# Patient Record
Sex: Female | Born: 1970 | Race: White | Hispanic: No | Marital: Single | Smoking: Former smoker
Health system: Southern US, Community
[De-identification: ages and names within clinical notes are randomized; demographics above are authoritative.]

## PROBLEM LIST (undated history)

## (undated) DIAGNOSIS — E039 Hypothyroidism, unspecified: Secondary | ICD-10-CM

## (undated) DIAGNOSIS — E559 Vitamin D deficiency, unspecified: Secondary | ICD-10-CM

## (undated) DIAGNOSIS — F419 Anxiety disorder, unspecified: Secondary | ICD-10-CM

## (undated) HISTORY — DX: Hypothyroidism, unspecified: E03.9

## (undated) HISTORY — PX: EYE SURGERY: SHX253

## (undated) HISTORY — PX: HERNIA REPAIR: SHX51

## (undated) HISTORY — PX: INCONTINENCE SURGERY: SHX676

## (undated) HISTORY — DX: Anxiety disorder, unspecified: F41.9

## (undated) HISTORY — DX: Vitamin D deficiency, unspecified: E55.9

---

## 2021-08-23 ENCOUNTER — Encounter: Payer: Self-pay | Admitting: Nurse Practitioner

## 2021-08-23 ENCOUNTER — Other Ambulatory Visit: Payer: Self-pay

## 2021-08-23 ENCOUNTER — Ambulatory Visit (INDEPENDENT_AMBULATORY_CARE_PROVIDER_SITE_OTHER): Payer: Managed Care, Other (non HMO) | Admitting: Nurse Practitioner

## 2021-08-23 VITALS — BP 120/70 | HR 88 | Temp 97.3°F | Ht 63.75 in | Wt 304.6 lb

## 2021-08-23 DIAGNOSIS — E039 Hypothyroidism, unspecified: Secondary | ICD-10-CM | POA: Insufficient documentation

## 2021-08-23 DIAGNOSIS — E559 Vitamin D deficiency, unspecified: Secondary | ICD-10-CM

## 2021-08-23 LAB — COMPREHENSIVE METABOLIC PANEL
ALT: 49 U/L — ABNORMAL HIGH (ref 0–35)
AST: 28 U/L (ref 0–37)
Albumin: 4.1 g/dL (ref 3.5–5.2)
Alkaline Phosphatase: 69 U/L (ref 39–117)
BUN: 16 mg/dL (ref 6–23)
CO2: 31 mEq/L (ref 19–32)
Calcium: 9.4 mg/dL (ref 8.4–10.5)
Chloride: 103 mEq/L (ref 96–112)
Creatinine, Ser: 0.73 mg/dL (ref 0.40–1.20)
GFR: 94.76 mL/min (ref >60.00)
Glucose, Bld: 81 mg/dL (ref 70–99)
Potassium: 4.4 mEq/L (ref 3.5–5.1)
Sodium: 140 mEq/L (ref 135–145)
Total Bilirubin: 0.4 mg/dL (ref 0.2–1.2)
Total Protein: 6.7 g/dL (ref 6.0–8.3)

## 2021-08-23 LAB — HEMOGLOBIN A1C: Hgb A1c MFr Bld: 6 % (ref 4.6–6.5)

## 2021-08-23 LAB — VITAMIN D 25 HYDROXY (VIT D DEFICIENCY, FRACTURES): VITD: 27.09 ng/mL — ABNORMAL LOW (ref 30.00–100.00)

## 2021-08-23 NOTE — Assessment & Plan Note (Addendum)
Repeat Vit. D: low Start 50000IU weekly Repeat in 6weeks

## 2021-08-23 NOTE — Assessment & Plan Note (Addendum)
Diagnosed 52yrs ago by pcp in [place], [place] Thyroid US also completed: no thyroid nodule per patient Current levothyroxine dose increased 60months ago. Continues to struggle with weight gain and faitgue.  Repeat thyroid panel today: low tsh and t4 D/c Maintain Repeat labs in 6weeks

## 2021-08-23 NOTE — Patient Instructions (Signed)
Thank you for choosing [facility]  Go to lab for blood day  Sign medical release form to get your records from previous providers.  Let me know if you want referral entered to [facility].  How to Increase Your Level of Physical Activity Getting regular physical activity is important for your overall health and well-being. Most people do not get enough exercise. There are easy ways to increase your level of physical activity, even if you have not been very active in the past or if you are just starting out. What are the benefits of physical activity? Physical activity has many short-term and long-term benefits. Being active on a regular basis can improve your physical and mental health as well as provide other benefits. Physical health benefits Helping you lose weight or maintain a healthy weight. Strengthening your muscles and bones. Reducing your risk of certain long-term (chronic) diseases, including heart disease, cancer, and diabetes. Being able to move around more easily and for longer periods of time without getting tired (increased endurance or stamina). Improving your ability to fight off illness (enhanced immunity). Being able to sleep better. Helping you stay healthy as you get older, including: Helping you stay mobile, or capable of walking and moving around. Preventing accidents, such as falls. Increasing life expectancy. Mental health benefits Boosting your mood and improving your self-esteem. Lowering your chance of having mental health problems, such as depression or anxiety. Helping you feel good about your body. Other benefits Finding new sources of fun and enjoyment. Meeting new people who share a common interest. Before you begin If you have a chronic illness or have not been active for a while, check with your health care provider about how to get started. Ask your health care provider what activities are safe for you. Start out  slowly. Walking or doing some simple chair exercises is a good place to start, especially if you have not been active before or for a long time. Set goals that you can work toward. Ask your health care provider how much exercise is best for you. In general, most adults should: Do moderate-intensity exercise for at least 150 minutes each week (30 minutes on most days of the week) or vigorous exercise for at least 75 minutes each week, or a combination of these. Moderate-intensity exercise can include walking at a quick pace, biking, yoga, water aerobics, or gardening. Vigorous exercise involves activities that take more effort, such as jogging or running, playing sports, swimming laps, or jumping rope. Do strength exercises on at least 2 days each week. This can include weight lifting, body weight exercises, and resistance-band exercises. How to be more physically active Make a plan  Try to find activities that you enjoy. You are more likely to commit to an exercise routine if it does not feel like a chore. If you have bone or joint problems, choose low-impact exercises, like walking or swimming. Use these tips for being successful with an exercise plan: Find a workout partner for accountability. Join a group or class, such as an aerobics class, cycling class, or sports team. Make family time active. Go for a walk, bike, or swim. Include a variety of exercises each week. Consider using a fitness tracker, such as a mobile phone app or a device worn like a watch, that will count the number of steps you take each day. Many people strive to reach 10,000 steps a day. Find ways to be active in your daily routines Besides  your formal exercise plans, you can find ways to do physical activity during your daily routines, such as: Walking or biking to work or to the store. Taking the stairs instead of the elevator. Parking farther away from the door at work or at the store. Planning walking  meetings. Walking around while you are on the phone. Where to find more information Centers for Disease Control and Prevention: [employer].pt [employer]'s Council on Fitness, Sports & Nutrition: www.fitness.gov ChooseMyPlate: http://www.harvey.com/ Contact a health care provider if: You have headaches, muscle aches, or joint pain that is concerning. You feel dizzy or light-headed while exercising. You faint. You feel your heart skipping, racing, or fluttering. You have chest pain while exercising. Summary Exercise benefits your mind and body at any age, even if you are just starting out. If you have a chronic illness or have not been active for a while, check with your health care provider before increasing your physical activity. Choose activities that are safe and enjoyable for you. Ask your health care provider what activities are safe for you. Start slowly. Tell your health care provider if you have problems as you start to increase your activity level. This information is not intended to replace advice given to you by your health care provider. Make sure you discuss any questions you have with your health care provider. Document Revised: 03/19/2021 Document Reviewed: 03/19/2021 Elsevier Patient Education  2022 [employer].

## 2021-08-23 NOTE — Progress Notes (Addendum)
Subjective:  Patient ID: Kaitlyn Fowler, female    DOB: [birth year]  Age: 50 y.o. MRN: [id number]  CC: Establish Care (New patient/Would like to have thyroid checked. /Declines vaccines today, will schedule a CPE and discuss at that visit. /)  HPI Relocated from [place], [place]  Obesity: Continuous weight gain despite dietary changes and decreased portion size. She stopped exercise regimen due to lack of results  Hypothyroidism Diagnosed 36yrs ago by pcp in [place], [place] Thyroid US also completed: no thyroid nodule per patient Current levothyroxine dose increased 29months ago. Continues to struggle with weight gain and faitgue.  Repeat thyroid panel today: low tsh and t4 D/c Maintain Repeat labs in 6weeks  Vitamin D deficiency Repeat Vit. D: low Start 50000IU weekly Repeat in 6weeks  Reviewed past Medical, Social and Family history today.  Outpatient Medications Prior to Visit  Medication Sig Dispense Refill   citalopram (CELEXA) 20 MG tablet Take 20 mg by mouth daily.     orphenadrine (NORFLEX) 100 MG tablet Take 100 mg by mouth 2 (two) times daily.     levothyroxine (SYNTHROID) 100 MCG tablet Take 100 mcg by mouth daily.     levothyroxine (SYNTHROID) 88 MCG tablet Take 88 mcg by mouth daily.     No facility-administered medications prior to visit.    ROS See HPI  Objective:  BP 120/70 (BP Location: Left Arm, Patient Position: Sitting, Cuff Size: Large)   Pulse 88   Temp (!) 97.3 F (36.3 C) (Temporal)   Ht 5' 3.75" (1.619 m)   Wt (!) 304 lb 9.6 oz (138.2 kg)   LMP 06/07/2021 (Within Weeks)   SpO2 98%   BMI 52.70 kg/m   Physical Exam Vitals reviewed.  Constitutional:      Appearance: She is obese.  Neck:     Thyroid: No thyroid mass, thyromegaly or thyroid tenderness.  Cardiovascular:     Rate and Rhythm: Normal rate and regular rhythm.     Pulses: Normal pulses.     Heart sounds: Normal heart sounds.  Pulmonary:     Effort: Pulmonary effort is  normal.     Breath sounds: Normal breath sounds.  Musculoskeletal:     Cervical back: Normal range of motion.     Right lower leg: No edema.     Left lower leg: No edema.  Neurological:     Mental Status: She is alert and oriented to person, place, and time.   Assessment & Plan:  This visit occurred during the SARS-CoV-2 public health emergency.  Safety protocols were in place, including screening questions prior to the visit, additional usage of staff PPE, and extensive cleaning of exam room while observing appropriate contact time as indicated for disinfecting solutions.   Analy was seen today for establish care.  Diagnoses and all orders for this visit:  Hypothyroidism, unspecified type -     Thyroid Panel With TSH -     Comprehensive metabolic panel -     T4, free; Future -     TSH; Future -     levothyroxine (SYNTHROID) 100 MCG tablet; Take 1 tablet (100 mcg total) by mouth daily before breakfast.  Morbid obesity (HCC) -     Comprehensive metabolic panel -     Hemoglobin A1c  Vitamin D deficiency -     Vitamin D (25 hydroxy) -     VITAMIN D 25 Hydroxy (Vit-D Deficiency, Fractures); Future -     Vitamin D, Ergocalciferol, (DRISDOL) 1.25 MG (  50000 UNIT) CAPS capsule; Take 1 capsule (50,000 Units total) by mouth every 7 (seven) days.   Problem List Items Addressed This Visit       Endocrine   Hypothyroidism - Primary    Diagnosed 62yrs ago by pcp in [place], [place] Thyroid US also completed: no thyroid nodule per patient Current levothyroxine dose increased 36months ago. Continues to struggle with weight gain and faitgue.  Repeat thyroid panel today: low tsh and t4 D/c Maintain Repeat labs in 6weeks      Relevant Medications   levothyroxine (SYNTHROID) 100 MCG tablet   Other Relevant Orders   Thyroid Panel With TSH (Completed)   Comprehensive metabolic panel (Completed)   T4, free   TSH     Other   Vitamin D deficiency    Repeat Vit. D: low Start  50000IU weekly Repeat in 6weeks      Relevant Medications   Vitamin D, Ergocalciferol, (DRISDOL) 1.25 MG (50000 UNIT) CAPS capsule   Other Relevant Orders   Vitamin D (25 hydroxy) (Completed)   VITAMIN D 25 Hydroxy (Vit-D Deficiency, Fractures)   Other Visit Diagnoses     Morbid obesity (HCC)       Relevant Orders   Comprehensive metabolic panel (Completed)   Hemoglobin A1c (Completed)       Follow-up: Return in about 3 months (around 11/22/2021) for CPE (fasting).  [name], NP

## 2021-08-24 LAB — THYROID PANEL WITH TSH
Free Thyroxine Index: 4.2 — ABNORMAL HIGH (ref 1.4–3.8)
T3 Uptake: 31 % (ref 22–35)
T4, Total: 13.4 ug/dL — ABNORMAL HIGH (ref 5.1–11.9)
TSH: 0.05 mIU/L — ABNORMAL LOW

## 2021-08-24 MED ORDER — VITAMIN D (ERGOCALCIFEROL) 1.25 MG (50000 UNIT) PO CAPS: 50000.0000 [IU] | ORAL_CAPSULE | ORAL | 0 refills | Status: DC

## 2021-08-24 MED ORDER — LEVOTHYROXINE SODIUM 100 MCG PO TABS: 100.0000 ug | ORAL_TABLET | Freq: Every day | ORAL | 1 refills | Status: DC

## 2021-08-24 NOTE — Addendum Note (Signed)
Addended by: [name] L on: 08/24/2021 01:42 PM   Modules accepted: Orders, Level of Service

## 2021-08-25 ENCOUNTER — Telehealth: Payer: Self-pay | Admitting: Nurse Practitioner

## 2021-08-25 NOTE — Telephone Encounter (Signed)
Pt is wanting a cb concerning her most recent lab results. Please advise 

## 2021-08-26 ENCOUNTER — Telehealth: Payer: Self-pay

## 2021-08-26 NOTE — Telephone Encounter (Signed)
Spoke to patient and gave lab results for ov 08/23/21. Informed patient of provider instructions. Patient voiced understanding. Sw, cma

## 2021-08-27 NOTE — Telephone Encounter (Signed)
Called and spoke to patient. All concerns resloved 

## 2021-10-07 ENCOUNTER — Other Ambulatory Visit (INDEPENDENT_AMBULATORY_CARE_PROVIDER_SITE_OTHER): Payer: Managed Care, Other (non HMO)

## 2021-10-07 ENCOUNTER — Other Ambulatory Visit: Payer: Self-pay

## 2021-10-07 DIAGNOSIS — E559 Vitamin D deficiency, unspecified: Secondary | ICD-10-CM

## 2021-10-07 DIAGNOSIS — E039 Hypothyroidism, unspecified: Secondary | ICD-10-CM

## 2021-10-08 LAB — TSH: TSH: 9.8 u[IU]/mL — ABNORMAL HIGH (ref 0.35–5.50)

## 2021-10-08 LAB — VITAMIN D 25 HYDROXY (VIT D DEFICIENCY, FRACTURES): VITD: 31.49 ng/mL (ref 30.00–100.00)

## 2021-10-08 LAB — T4, FREE: Free T4: 0.87 ng/dL (ref 0.60–1.60)

## 2021-10-10 MED ORDER — LEVOTHYROXINE SODIUM 112 MCG PO TABS: 112.0000 ug | ORAL_TABLET | Freq: Every day | ORAL | 1 refills | Status: DC

## 2021-10-10 MED ORDER — VITAMIN D3 125 MCG (5000 UT) PO CAPS: 5000.0000 [IU] | ORAL_CAPSULE | Freq: Every day | ORAL | Status: AC

## 2021-10-10 NOTE — Addendum Note (Signed)
Addended by: [name] L on: 10/10/2021 12:01 PM   Modules accepted: Orders

## 2021-10-10 NOTE — Progress Notes (Signed)
See note

## 2021-10-22 ENCOUNTER — Encounter: Payer: Self-pay | Admitting: Nurse Practitioner

## 2021-11-20 ENCOUNTER — Other Ambulatory Visit: Payer: Self-pay | Admitting: Nurse Practitioner

## 2021-11-20 DIAGNOSIS — E559 Vitamin D deficiency, unspecified: Secondary | ICD-10-CM

## 2021-11-22 ENCOUNTER — Ambulatory Visit (INDEPENDENT_AMBULATORY_CARE_PROVIDER_SITE_OTHER): Payer: Managed Care, Other (non HMO) | Admitting: Nurse Practitioner

## 2021-11-22 ENCOUNTER — Other Ambulatory Visit: Payer: Self-pay

## 2021-11-22 ENCOUNTER — Encounter: Payer: Self-pay | Admitting: Nurse Practitioner

## 2021-11-22 ENCOUNTER — Other Ambulatory Visit (HOSPITAL_COMMUNITY)
Admission: RE | Admit: 2021-11-22 | Discharge: 2021-11-22 | Disposition: A | Discharge status: Home / Self Care | Payer: Managed Care, Other (non HMO) | Source: Ambulatory Visit | Attending: Nurse Practitioner | Admitting: Nurse Practitioner

## 2021-11-22 VITALS — BP 124/84 | HR 68 | Temp 97.4°F | Ht 63.0 in | Wt 317.6 lb

## 2021-11-22 DIAGNOSIS — Z13220 Encounter for screening for lipoid disorders: Secondary | ICD-10-CM

## 2021-11-22 DIAGNOSIS — Z1231 Encounter for screening mammogram for malignant neoplasm of breast: Secondary | ICD-10-CM | POA: Diagnosis not present

## 2021-11-22 DIAGNOSIS — Z124 Encounter for screening for malignant neoplasm of cervix: Secondary | ICD-10-CM

## 2021-11-22 DIAGNOSIS — Z1211 Encounter for screening for malignant neoplasm of colon: Secondary | ICD-10-CM | POA: Diagnosis not present

## 2021-11-22 DIAGNOSIS — E039 Hypothyroidism, unspecified: Secondary | ICD-10-CM

## 2021-11-22 DIAGNOSIS — Z136 Encounter for screening for cardiovascular disorders: Secondary | ICD-10-CM

## 2021-11-22 DIAGNOSIS — R739 Hyperglycemia, unspecified: Secondary | ICD-10-CM

## 2021-11-22 DIAGNOSIS — Z0001 Encounter for general adult medical examination with abnormal findings: Secondary | ICD-10-CM

## 2021-11-22 DIAGNOSIS — E559 Vitamin D deficiency, unspecified: Secondary | ICD-10-CM

## 2021-11-22 NOTE — Patient Instructions (Addendum)
Find out if Cologuard is covered by your insurance  Schedule appt for annual eye exam [facility] [employer].  Schedule appt for dental cleaning  You will be contacted to schedule appt for colonoscopy and mammogram  Schedule fasting lab appt.  Preventive Care 28-52 Years Old, Female Preventive care refers to lifestyle choices and visits with your health care provider that can promote health and wellness. Preventive care visits are also called wellness exams. What can I expect for my preventive care visit? Counseling Your health care provider may ask you questions about your: Medical history, including: Past medical problems. Family medical history. Pregnancy history. Current health, including: Menstrual cycle. Method of birth control. Emotional well-being. Home life and relationship well-being. Sexual activity and sexual health. Lifestyle, including: Alcohol, nicotine or tobacco, and drug use. Access to firearms. Diet, exercise, and sleep habits. Work and work Statistician. Sunscreen use. Safety issues such as seatbelt and bike helmet use. Physical exam Your health care provider will check your: Height and weight. These may be used to calculate your BMI (body mass index). BMI is a measurement that tells if you are at a healthy weight. Waist circumference. This measures the distance around your waistline. This measurement also tells if you are at a healthy weight and may help predict your risk of certain diseases, such as type 2 diabetes and high blood pressure. Heart rate and blood pressure. Body temperature. Skin for abnormal spots. What immunizations do I need? Vaccines are usually given at various ages, according to a schedule. Your health care provider will recommend vaccines for you based on your age, medical history, and lifestyle or other factors, such as travel or where you work. What tests do I need? Screening Your health care provider  may recommend screening tests for certain conditions. This may include: Lipid and cholesterol levels. Diabetes screening. This is done by checking your blood sugar (glucose) after you have not eaten for a while (fasting). Pelvic exam and Pap test. Hepatitis B test. Hepatitis C test. HIV (human immunodeficiency virus) test. STI (sexually transmitted infection) testing, if you are at risk. Lung cancer screening. Colorectal cancer screening. Mammogram. Talk with your health care provider about when you should start having regular mammograms. This may depend on whether you have a family history of breast cancer. BRCA-related cancer screening. This may be done if you have a family history of breast, ovarian, tubal, or peritoneal cancers. Bone density scan. This is done to screen for osteoporosis. Talk with your health care provider about your test results, treatment options, and if necessary, the need for more tests. Follow these instructions at home: Eating and drinking  Eat a diet that includes fresh fruits and vegetables, whole grains, lean protein, and low-fat dairy products. Take vitamin and mineral supplements as recommended by your health care provider. Do not drink alcohol if: Your health care provider tells you not to drink. You are pregnant, may be pregnant, or are planning to become pregnant. If you drink alcohol: Limit how much you have to 0-1 drink a day. Know how much alcohol is in your drink. In the [place] one drink equals one 12 oz bottle of beer (355 mL), one 5 oz glass of wine (148 mL), or one 1 oz glass of hard liquor (44 mL). Lifestyle Brush your teeth every morning and night with fluoride toothpaste. Floss one time each day. Exercise for at least 30 minutes 5 or more days each week. Do not use any products  that contain nicotine or tobacco. These products include cigarettes, chewing tobacco, and vaping devices, such as e-cigarettes. If you need help quitting, ask your  health care provider. Do not use drugs. If you are sexually active, practice safe sex. Use a condom or other form of protection to prevent STIs. If you do not wish to become pregnant, use a form of birth control. If you plan to become pregnant, see your health care provider for a prepregnancy visit. Take aspirin only as told by your health care provider. Make sure that you understand how much to take and what form to take. Work with your health care provider to find out whether it is safe and beneficial for you to take aspirin daily. Find healthy ways to manage stress, such as: Meditation, yoga, or listening to music. Journaling. Talking to a trusted person. Spending time with friends and family. Minimize exposure to UV radiation to reduce your risk of skin cancer. Safety Always wear your seat belt while driving or riding in a vehicle. Do not drive: If you have been drinking alcohol. Do not ride with someone who has been drinking. When you are tired or distracted. While texting. If you have been using any mind-altering substances or drugs. Wear a helmet and other protective equipment during sports activities. If you have firearms in your house, make sure you follow all gun safety procedures. Seek help if you have been physically or sexually abused. What's next? Visit your health care provider once a year for an annual wellness visit. Ask your health care provider how often you should have your eyes and teeth checked. Stay up to date on all vaccines. This information is not intended to replace advice given to you by your health care provider. Make sure you discuss any questions you have with your health care provider. Document Revised: 05/19/2021 Document Reviewed: 05/19/2021 Elsevier Patient Education  [employer].

## 2021-11-22 NOTE — Progress Notes (Signed)
Subjective:    Patient ID: Kaitlyn Fowler, female    DOB: [birth year], 50 y.o.   MRN: [id number]  Patient presents today for CPE and eval of chronic conditions  HPI Hypothyroidism Repeat TSH and T4 Maintain current levothyroxine dose  Vitamin D deficiency Repeat Vit. D Vision: will schedule Dental:will schedule Diet:regular Exercise:walking some, plans to increase frequency. Weight:  Wt Readings from Last 3 Encounters:  11/22/21 (!) 317 lb 9.6 oz (144.1 kg)  08/23/21 (!) 304 lb 9.6 oz (138.2 kg)    Sexual History (orientation,birth control, marital status, STD):no STD screen, needs breast and pelvic exam today  Depression/Suicide: Depression screen [facility] 2/9 11/22/2021 08/23/2021  Decreased Interest 0 0  Down, Depressed, Hopeless 0 0  PHQ - 2 Score 0 0  Altered sleeping 1 3  Tired, decreased energy 1 2  Change in appetite 0 0  Feeling bad or failure about yourself  0 0  Trouble concentrating 0 0  Moving slowly or fidgety/restless 0 0  Suicidal thoughts 0 0  PHQ-9 Score 2 5  Difficult doing work/chores Not difficult at all Somewhat difficult   GAD 7 : Generalized Anxiety Score 11/22/2021 08/23/2021  Nervous, Anxious, on Edge 0 0  Control/stop worrying 0 0  Worry too much - different things 0 0  Trouble relaxing 0 0  Restless 0 0  Easily annoyed or irritable 0 0  Afraid - awful might happen 0 0  Total GAD 7 Score 0 0    Immunizations: (TDAP, Hep C screen, Pneumovax, Influenza, zoster)  Health Maintenance  Topic Date Due   HIV Screening  Never done   Pap Smear  Never done   Colon Cancer Screening  Never done   Mammogram  Never done   Zoster (Shingles) Vaccine (1 of 2) Never done   COVID-19 Vaccine (4 - Booster for Pfizer series) 03/13/2021   Flu Shot  03/04/2022*   Hepatitis C Screening: USPSTF Recommendation to screen - Ages 18-79 yo.  11/22/2022*   Tetanus Vaccine  11/16/2026   Pneumococcal Vaccination  Aged Out   HPV Vaccine  Aged Out  *Topic was postponed.  The date shown is not the original due date.   Fall Risk: Fall Risk  11/22/2021 08/23/2021  Falls in the past year? 0 0  Number falls in past yr: 0 0  Injury with Fall? 0 0  Risk for fall due to : No Fall Risks No Fall Risks  Follow up Falls evaluation completed Falls evaluation completed   Medications and allergies reviewed with patient and updated if appropriate.  Patient Active Problem List   Diagnosis Date Noted   Hypothyroidism 08/23/2021   Vitamin D deficiency 08/23/2021    Current Outpatient Medications on File Prior to Visit  Medication Sig Dispense Refill   Cholecalciferol (VITAMIN D3) 125 MCG (5000 UT) CAPS Take 1 capsule (5,000 Units total) by mouth daily. 30 capsule    citalopram (CELEXA) 20 MG tablet Take 20 mg by mouth daily.     levothyroxine (SYNTHROID) 112 MCG tablet Take 1 tablet (112 mcg total) by mouth daily before breakfast. 90 tablet 1   orphenadrine (NORFLEX) 100 MG tablet Take 100 mg by mouth 2 (two) times daily.     No current facility-administered medications on file prior to visit.    History reviewed. No pertinent past medical history.  Past Surgical History:  Procedure Laterality Date   HERNIA REPAIR     INCONTINENCE SURGERY      Social History  Socioeconomic History   Marital status: Single    Spouse name: Not on file   Number of children: 1   Years of education: Not on file   Highest education level: Not on file  Occupational History   Not on file  Tobacco Use   Smoking status: Former    Types: Cigarettes   Smokeless tobacco: Never   Tobacco comments:    Stopped 2018  Vaping Use   Vaping Use: Never used  Substance and Sexual Activity   Alcohol use: Never   Drug use: Never   Sexual activity: Not Currently    Birth control/protection: None  Other Topics Concern   Not on file  Social History Narrative   Not on file   Social Determinants of Health   Financial Resource Strain: Not on file  Food Insecurity: Not on file   Transportation Needs: Not on file  Physical Activity: Not on file  Stress: Not on file  Social Connections: Not on file   Family History  Adopted: Yes       Review of Systems  Constitutional:  Negative for fever, malaise/fatigue and weight loss.  HENT:  Negative for congestion and sore throat.   Eyes:        Negative for visual changes  Respiratory:  Negative for cough and shortness of breath.   Cardiovascular:  Negative for chest pain, palpitations and leg swelling.  Gastrointestinal:  Negative for blood in stool, constipation, diarrhea and heartburn.  Genitourinary:  Negative for dysuria, frequency and urgency.  Musculoskeletal:  Negative for falls, joint pain and myalgias.  Skin:  Negative for rash.  Neurological:  Negative for dizziness, sensory change and headaches.  Endo/Heme/Allergies:  Does not bruise/bleed easily.  Psychiatric/Behavioral:  Negative for depression, hallucinations, substance abuse and suicidal ideas. The patient is not nervous/anxious and does not have insomnia.    Objective:   Vitals:   11/22/21 1341  BP: 124/84  Pulse: 68  Temp: (!) 97.4 F (36.3 C)  SpO2: 98%   Body mass index is 56.26 kg/m.  Physical Examination:  Physical Exam Vitals reviewed. Exam conducted with a chaperone present.  Constitutional:      General: She is not in acute distress.    Appearance: She is well-developed. She is obese.  HENT:     Right Ear: Tympanic membrane, ear canal and external ear normal.     Left Ear: Tympanic membrane, ear canal and external ear normal.     Nose: Nose normal.  Eyes:     Extraocular Movements: Extraocular movements intact.     Conjunctiva/sclera: Conjunctivae normal.  Cardiovascular:     Rate and Rhythm: Normal rate and regular rhythm.     Pulses: Normal pulses.     Heart sounds: Normal heart sounds.  Pulmonary:     Effort: Pulmonary effort is normal. No respiratory distress.     Breath sounds: Normal breath sounds.  Chest:      Chest wall: No tenderness.  Breasts:    Breasts are symmetrical.     Left: Normal.  Abdominal:     General: Bowel sounds are normal.     Palpations: Abdomen is soft.     Hernia: There is no hernia in the left inguinal area or right inguinal area.  Genitourinary:    General: Normal vulva.     Labia:        Right: No rash, tenderness or lesion.        Left: No rash, tenderness or lesion.  Vagina: Vaginal discharge present. No erythema, tenderness or bleeding.     Cervix: Normal.     Uterus: Normal.      Adnexa: Right adnexa normal and left adnexa normal.  Musculoskeletal:        General: Normal range of motion.     Cervical back: Normal range of motion and neck supple.     Right lower leg: No edema.     Left lower leg: No edema.  Lymphadenopathy:     Cervical: No cervical adenopathy.     Upper Body:     Right upper body: No supraclavicular, axillary or pectoral adenopathy.     Left upper body: No supraclavicular, axillary or pectoral adenopathy.     Lower Body: No right inguinal adenopathy. No left inguinal adenopathy.  Skin:    General: Skin is warm and dry.  Neurological:     Mental Status: She is alert and oriented to person, place, and time.     Deep Tendon Reflexes: Reflexes are normal and symmetric.  Psychiatric:        Mood and Affect: Mood normal.        Behavior: Behavior normal.        Thought Content: Thought content normal.    ASSESSMENT and PLAN: This visit occurred during the SARS-CoV-2 public health emergency.  Safety protocols were in place, including screening questions prior to the visit, additional usage of staff PPE, and extensive cleaning of exam room while observing appropriate contact time as indicated for disinfecting solutions.   Eliz was seen today for annual exam.  Diagnoses and all orders for this visit:  Encounter for preventative adult health care exam with abnormal findings -     MM 3D SCREEN BREAST BILATERAL; Future -     Ambulatory  referral to Gastroenterology -     CBC w/Diff; Future -     Cytology - PAP  Vitamin D deficiency -     Vitamin D (25 hydroxy); Future  Hypothyroidism, unspecified type -     T4, free; Future -     TSH; Future -     MM 3D SCREEN BREAST BILATERAL; Future  Encounter for lipid screening for cardiovascular disease  Breast cancer screening by mammogram -     MM 3D SCREEN BREAST BILATERAL; Future  Colon cancer screening -     Ambulatory referral to Gastroenterology  Hyperglycemia -     Hemoglobin A1c; Future  Encounter for Papanicolaou smear for cervical cancer screening -     Cytology - PAP      Problem List Items Addressed This Visit       Endocrine   Hypothyroidism    Repeat TSH and T4 Maintain current levothyroxine dose      Relevant Orders   T4, free   TSH   MM 3D SCREEN BREAST BILATERAL     Other   Vitamin D deficiency    Repeat Vit. D      Relevant Orders   Vitamin D (25 hydroxy)   Other Visit Diagnoses     Encounter for preventative adult health care exam with abnormal findings    -  Primary   Relevant Orders   MM 3D SCREEN BREAST BILATERAL   Ambulatory referral to Gastroenterology   CBC w/Diff   Cytology - PAP   Encounter for lipid screening for cardiovascular disease       Breast cancer screening by mammogram       Relevant Orders   MM 3D  SCREEN BREAST BILATERAL   Colon cancer screening       Relevant Orders   Ambulatory referral to Gastroenterology   Hyperglycemia       Relevant Orders   Hemoglobin A1c   Encounter for Papanicolaou smear for cervical cancer screening       Relevant Orders   Cytology - PAP       Follow up: Return in about 3 months (around 02/20/2022) for hypothyrodism, weight management.  [name], NP

## 2021-11-22 NOTE — Assessment & Plan Note (Signed)
Repeat TSH and T4 °Maintain current levothyroxine dose °

## 2021-11-22 NOTE — Assessment & Plan Note (Signed)
Repeat Vit. D 

## 2021-11-24 ENCOUNTER — Telehealth: Payer: Self-pay | Admitting: Nurse Practitioner

## 2021-11-24 LAB — CYTOLOGY - PAP
Comment: NEGATIVE
Diagnosis: NEGATIVE
High risk HPV: NEGATIVE

## 2021-11-24 NOTE — Telephone Encounter (Signed)
What is the name of the medication? Cholecalciferol (VITAMIN D3) 125 MCG (5000 UT) CAPS [[id number]]   Have you contacted your pharmacy to request a refill? Pt is needing a refill.  Which pharmacy would you like this sent to? [id number],  - [address]  [address] [place] [address]  Phone:  [phone]  Fax:  [phone]  DEA #:  [id number]  Patient notified that their request is being sent to the clinical staff for review and that they should receive a call once it is complete. If they do not receive a call within 72 hours they can check with their pharmacy or our office.

## 2021-11-24 NOTE — Telephone Encounter (Signed)
Pt informed medication is OTC medication and does not need a prescription. Pt verbalized understanding.

## 2021-11-25 ENCOUNTER — Other Ambulatory Visit (INDEPENDENT_AMBULATORY_CARE_PROVIDER_SITE_OTHER): Payer: Managed Care, Other (non HMO)

## 2021-11-25 ENCOUNTER — Other Ambulatory Visit: Payer: Self-pay

## 2021-11-25 DIAGNOSIS — E559 Vitamin D deficiency, unspecified: Secondary | ICD-10-CM

## 2021-11-25 DIAGNOSIS — Z0001 Encounter for general adult medical examination with abnormal findings: Secondary | ICD-10-CM

## 2021-11-25 DIAGNOSIS — R739 Hyperglycemia, unspecified: Secondary | ICD-10-CM | POA: Diagnosis not present

## 2021-11-25 DIAGNOSIS — E039 Hypothyroidism, unspecified: Secondary | ICD-10-CM | POA: Diagnosis not present

## 2021-11-25 LAB — CBC WITH DIFFERENTIAL/PLATELET
Basophils Absolute: 0 10*3/uL (ref 0.0–0.1)
Basophils Relative: 0.5 % (ref 0.0–3.0)
Eosinophils Absolute: 0.2 10*3/uL (ref 0.0–0.7)
Eosinophils Relative: 2.6 % (ref 0.0–5.0)
HCT: 41.3 % (ref 36.0–46.0)
Hemoglobin: 13.3 g/dL (ref 12.0–15.0)
Lymphocytes Relative: 29.7 % (ref 12.0–46.0)
Lymphs Abs: 2 10*3/uL (ref 0.7–4.0)
MCHC: 32.1 g/dL (ref 30.0–36.0)
MCV: 79.7 fl (ref 78.0–100.0)
Monocytes Absolute: 0.4 10*3/uL (ref 0.1–1.0)
Monocytes Relative: 5.8 % (ref 3.0–12.0)
Neutro Abs: 4.1 10*3/uL (ref 1.4–7.7)
Neutrophils Relative %: 61.4 % (ref 43.0–77.0)
Platelets: 342 10*3/uL (ref 150.0–400.0)
RBC: 5.18 Mil/uL — ABNORMAL HIGH (ref 3.87–5.11)
RDW: 15.3 % (ref 11.5–15.5)
WBC: 6.6 10*3/uL (ref 4.0–10.5)

## 2021-11-25 LAB — TSH: TSH: 15.8 u[IU]/mL — ABNORMAL HIGH (ref 0.35–5.50)

## 2021-11-25 LAB — T4, FREE: Free T4: 0.91 ng/dL (ref 0.60–1.60)

## 2021-11-25 LAB — HEMOGLOBIN A1C: Hgb A1c MFr Bld: 6.1 % (ref 4.6–6.5)

## 2021-11-25 LAB — VITAMIN D 25 HYDROXY (VIT D DEFICIENCY, FRACTURES): VITD: 34.07 ng/mL (ref 30.00–100.00)

## 2021-11-26 LAB — THYROID PANEL WITH TSH
Free Thyroxine Index: 2.5 (ref 1.4–3.8)
T3 Uptake: 26 % (ref 22–35)
T4, Total: 9.7 ug/dL (ref 5.1–11.9)
TSH: 17.67 mIU/L — ABNORMAL HIGH

## 2021-11-29 MED ORDER — LEVOTHYROXINE SODIUM 125 MCG PO TABS: 125.0000 ug | ORAL_TABLET | Freq: Every day | ORAL | 0 refills | Status: DC

## 2021-12-01 ENCOUNTER — Encounter: Payer: Self-pay | Admitting: Nurse Practitioner

## 2021-12-02 NOTE — Telephone Encounter (Signed)
Per last lab notes patient is to take 5000 units daily. Pt already has been notified and made aware of this

## 2021-12-10 ENCOUNTER — Encounter: Payer: Self-pay | Admitting: Nurse Practitioner

## 2021-12-30 ENCOUNTER — Other Ambulatory Visit: Payer: Self-pay

## 2021-12-30 ENCOUNTER — Other Ambulatory Visit (INDEPENDENT_AMBULATORY_CARE_PROVIDER_SITE_OTHER): Payer: Managed Care, Other (non HMO)

## 2021-12-30 DIAGNOSIS — E039 Hypothyroidism, unspecified: Secondary | ICD-10-CM

## 2021-12-31 LAB — THYROID PANEL WITH TSH
Free Thyroxine Index: 2.5 (ref 1.4–3.8)
T3 Uptake: 28 % (ref 22–35)
T4, Total: 8.8 ug/dL (ref 5.1–11.9)
TSH: 10.12 m[IU]/L — ABNORMAL HIGH

## 2021-12-31 MED ORDER — LEVOTHYROXINE SODIUM 125 MCG PO TABS: 125.0000 ug | ORAL_TABLET | Freq: Every day | ORAL | 0 refills | Status: DC

## 2021-12-31 NOTE — Addendum Note (Signed)
Addended by: [name] L on: 12/31/2021 02:50 PM   Modules accepted: Orders

## 2022-01-06 ENCOUNTER — Ambulatory Visit
Admission: RE | Admit: 2022-01-06 | Discharge: 2022-01-06 | Disposition: A | Discharge status: Home / Self Care | Payer: Managed Care, Other (non HMO) | Source: Ambulatory Visit | Attending: Nurse Practitioner | Admitting: Nurse Practitioner

## 2022-01-06 DIAGNOSIS — Z0001 Encounter for general adult medical examination with abnormal findings: Secondary | ICD-10-CM

## 2022-01-06 DIAGNOSIS — Z1231 Encounter for screening mammogram for malignant neoplasm of breast: Secondary | ICD-10-CM

## 2022-01-06 DIAGNOSIS — E039 Hypothyroidism, unspecified: Secondary | ICD-10-CM

## 2022-01-07 ENCOUNTER — Ambulatory Visit
Admission: RE | Admit: 2022-01-07 | Discharge: 2022-01-07 | Disposition: A | Discharge status: Home / Self Care | Payer: Managed Care, Other (non HMO) | Source: Ambulatory Visit | Attending: Nurse Practitioner | Admitting: Nurse Practitioner

## 2022-01-07 ENCOUNTER — Other Ambulatory Visit: Payer: Self-pay

## 2022-01-10 ENCOUNTER — Encounter: Payer: Self-pay | Admitting: Nurse Practitioner

## 2022-01-10 NOTE — Telephone Encounter (Signed)
Refill request for: Citalopram 20 mg LR  HX provider LOV 11/15/18/22 FOV  02/24/22  Please review and advise.  Thanks.  Dm/cma

## 2022-01-11 MED ORDER — CITALOPRAM HYDROBROMIDE 20 MG PO TABS: 20.0000 mg | ORAL_TABLET | Freq: Every day | ORAL | 3 refills | Status: DC

## 2022-01-17 ENCOUNTER — Other Ambulatory Visit: Payer: Self-pay | Admitting: Nurse Practitioner

## 2022-01-17 DIAGNOSIS — R928 Other abnormal and inconclusive findings on diagnostic imaging of breast: Secondary | ICD-10-CM

## 2022-01-28 ENCOUNTER — Ambulatory Visit
Admission: RE | Admit: 2022-01-28 | Discharge: 2022-01-28 | Disposition: A | Discharge status: Home / Self Care | Payer: Managed Care, Other (non HMO) | Source: Ambulatory Visit | Attending: Nurse Practitioner | Admitting: Nurse Practitioner

## 2022-01-28 ENCOUNTER — Other Ambulatory Visit: Payer: Self-pay

## 2022-01-28 DIAGNOSIS — R928 Other abnormal and inconclusive findings on diagnostic imaging of breast: Secondary | ICD-10-CM

## 2022-02-24 ENCOUNTER — Ambulatory Visit: Payer: Managed Care, Other (non HMO) | Admitting: Nurse Practitioner

## 2022-02-25 ENCOUNTER — Other Ambulatory Visit: Payer: Self-pay | Admitting: Nurse Practitioner

## 2022-02-25 DIAGNOSIS — E039 Hypothyroidism, unspecified: Secondary | ICD-10-CM

## 2022-02-28 ENCOUNTER — Encounter: Payer: Self-pay | Admitting: Nurse Practitioner

## 2022-03-04 ENCOUNTER — Telehealth: Payer: Self-pay | Admitting: Nurse Practitioner

## 2022-03-04 ENCOUNTER — Other Ambulatory Visit: Payer: Self-pay

## 2022-03-04 DIAGNOSIS — E039 Hypothyroidism, unspecified: Secondary | ICD-10-CM

## 2022-03-04 MED ORDER — LEVOTHYROXINE SODIUM 125 MCG PO TABS: 125.0000 ug | ORAL_TABLET | Freq: Every day | ORAL | 0 refills | Status: DC

## 2022-03-04 NOTE — Telephone Encounter (Signed)
Pt has moved and Rx needs to be sent to new pharmacy.  ?

## 2022-03-04 NOTE — Telephone Encounter (Signed)
Pt is requesting that her meds be sent to the [facility] she needs her levothyroxine refilled she took her last pill today. ?

## 2022-03-07 NOTE — Telephone Encounter (Signed)
Rx filled on 03/04/22 ?

## 2022-06-23 ENCOUNTER — Ambulatory Visit: Payer: Managed Care, Other (non HMO) | Admitting: Nurse Practitioner

## 2022-06-23 ENCOUNTER — Encounter: Payer: Self-pay | Admitting: Nurse Practitioner

## 2022-06-23 VITALS — BP 133/84 | HR 65 | Wt 314.8 lb

## 2022-06-23 DIAGNOSIS — N3001 Acute cystitis with hematuria: Secondary | ICD-10-CM

## 2022-06-23 DIAGNOSIS — R350 Frequency of micturition: Secondary | ICD-10-CM | POA: Diagnosis not present

## 2022-06-23 LAB — POCT URINALYSIS DIPSTICK
Bilirubin, UA: NEGATIVE
Glucose, UA: NEGATIVE
Ketones, UA: NEGATIVE
Nitrite, UA: NEGATIVE
Protein, UA: POSITIVE — AB
Spec Grav, UA: >=1.03 — AB
Urobilinogen, UA: NEGATIVE U/dL — AB
pH, UA: 5.5

## 2022-06-23 MED ORDER — NITROFURANTOIN MONOHYD MACRO 100 MG PO CAPS: 100.0000 mg | ORAL_CAPSULE | Freq: Two times a day (BID) | ORAL | 0 refills | Status: DC

## 2022-06-23 NOTE — Patient Instructions (Signed)
It was great to see you!  Start macrobid twice a day for 5 days with food. Drink plenty of fluids. You can take azo as needed over the counter for urinary pain or burning.   Let's follow-up if your symptoms worsen or don't improve.   Take care,  Rodman Pickle, NP

## 2022-06-23 NOTE — Progress Notes (Signed)
Acute Office Visit  Subjective:     Patient ID: Kaitlyn Fowler, female    DOB: [birth year], 51 y.o.   MRN: [id number]  Chief Complaint  Patient presents with   Urinary Tract Infection    Pt c/o possible UTI w/ urinary frequency, bladder pressure x3 days    HPI Patient is in today for urinary frequency and bladder pressure for 3 days.  URINARY SYMPTOMS  Dysuria: no Urinary frequency: yes Urgency: yes Small volume voids: yes Urinary incontinence: no Foul odor: no Hematuria: no Abdominal pain: no Back pain: no Suprapubic pain/pressure: yes Flank pain: no Fever:  no Vomiting: no Relief with cranberry juice:  n/a Relief with pyridium:  n/a Previous urinary tract infection: yes Recurrent urinary tract infection: no Treatments attempted: increasing fluids   ROS See pertinent positives and negatives per HPI.     Objective:    BP 133/84 (BP Location: Right Wrist, Patient Position: Sitting, Cuff Size: Large)   Pulse 65   Wt (!) 314 lb 12.8 oz (142.8 kg)   SpO2 98%   BMI 55.76 kg/m    Physical Exam Vitals and nursing note reviewed.  Constitutional:      General: She is not in acute distress.    Appearance: Normal appearance.  HENT:     Head: Normocephalic.  Eyes:     Conjunctiva/sclera: Conjunctivae normal.  Pulmonary:     Effort: Pulmonary effort is normal.  Abdominal:     Palpations: Abdomen is soft.     Tenderness: There is no abdominal tenderness. There is no right CVA tenderness or left CVA tenderness.  Musculoskeletal:     Cervical back: Normal range of motion.  Skin:    General: Skin is warm.  Neurological:     General: No focal deficit present.     Mental Status: She is alert and oriented to person, place, and time.  Psychiatric:        Mood and Affect: Mood normal.        Behavior: Behavior normal.        Thought Content: Thought content normal.        Judgment: Judgment normal.     Results for orders placed or performed in visit on  06/23/22  POCT urinalysis dipstick  Result Value Ref Range   Color, UA Yellow    Clarity, UA Cloudy    Glucose, UA Negative Negative   Bilirubin, UA Negative    Ketones, UA negative    Spec Grav, UA >=1.030 (A) 1.010 - 1.025   Blood, UA Large    pH, UA 5.5 5.0 - 8.0   Protein, UA Positive (A) Negative   Urobilinogen, UA negative (A) 0.2 or 1.0 E.U./dL   Nitrite, UA Negative    Leukocytes, UA Large (3+) (A) Negative   Appearance     Odor          Assessment & Plan:   Problem List Items Addressed This Visit   None Visit Diagnoses     Acute cystitis with hematuria    -  Primary   Treat with Macrobid BID x5 days. Encourage fluids. can take azo otc prn dysuria. F/U if not improving. Add on urine culture.    Relevant Orders   Urine Culture   Urinary frequency       U/A shows blood, protein, and 3+ leukocytes. With symptoms will treat for a UTI   Relevant Orders   POCT urinalysis dipstick (Completed)       Meds  ordered this encounter  Medications   nitrofurantoin, macrocrystal-monohydrate, (MACROBID) 100 MG capsule    Sig: Take 1 capsule (100 mg total) by mouth 2 (two) times daily.    Dispense:  10 capsule    Refill:  0    Return if symptoms worsen or fail to improve.  Gerre Scull, NP

## 2022-06-25 LAB — URINE CULTURE
MICRO NUMBER:: 13673222
SPECIMEN QUALITY:: ADEQUATE

## 2022-08-19 ENCOUNTER — Other Ambulatory Visit: Payer: Self-pay | Admitting: Nurse Practitioner

## 2022-08-19 DIAGNOSIS — E039 Hypothyroidism, unspecified: Secondary | ICD-10-CM

## 2022-08-19 NOTE — Telephone Encounter (Signed)
Chart supports Rx Last OV: 06/2022 Next OV: not scheduled   

## 2022-09-07 IMAGING — MG DIGITAL DIAGNOSTIC BILAT W/ TOMO W/ CAD
8 of 15 series · 8 of 40 positions shown · non-contrast
Comparison: Multiple prior exams, including studies from [facility]
[REDACTED] [REDACTED]

CLINICAL DATA: Patient returns after screening study for evaluation
of possible RIGHT breast distortion and mass and possible LEFT
breast distortion and mass.



[L ML synth-2D]
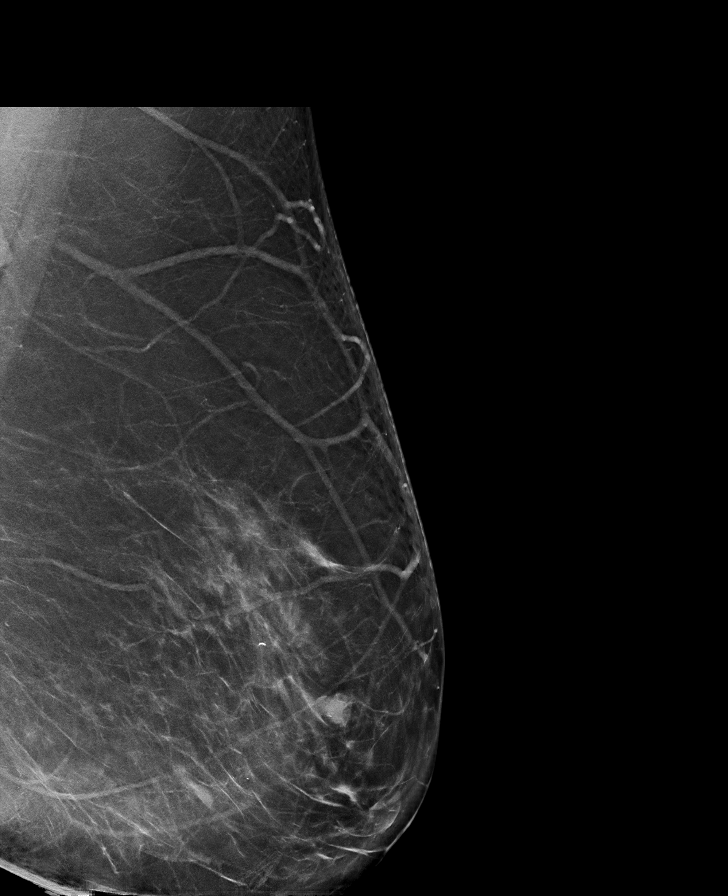

[L MLO synth-2D (1 of 2)]
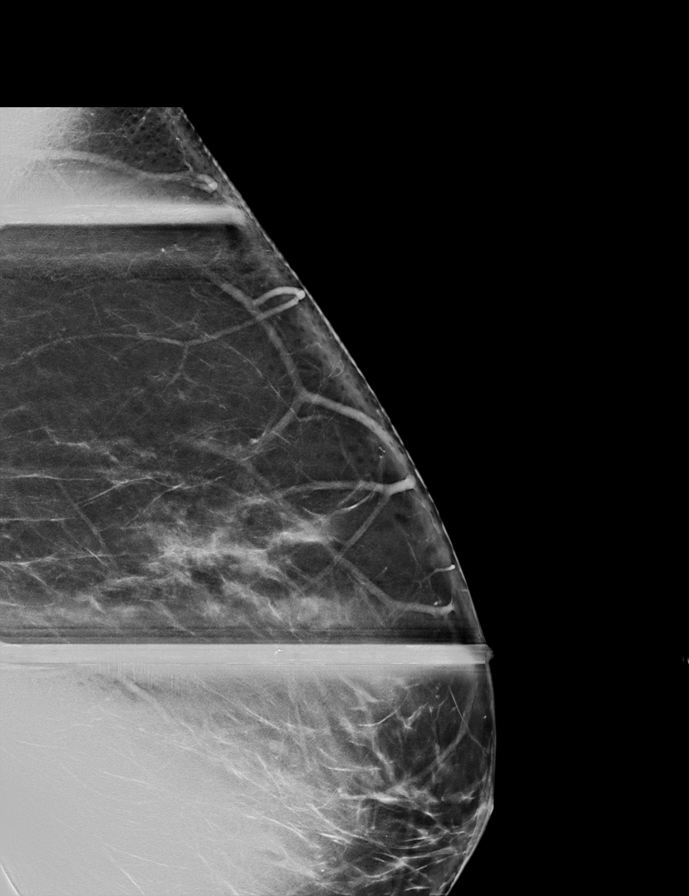

[L CC synth-2D]
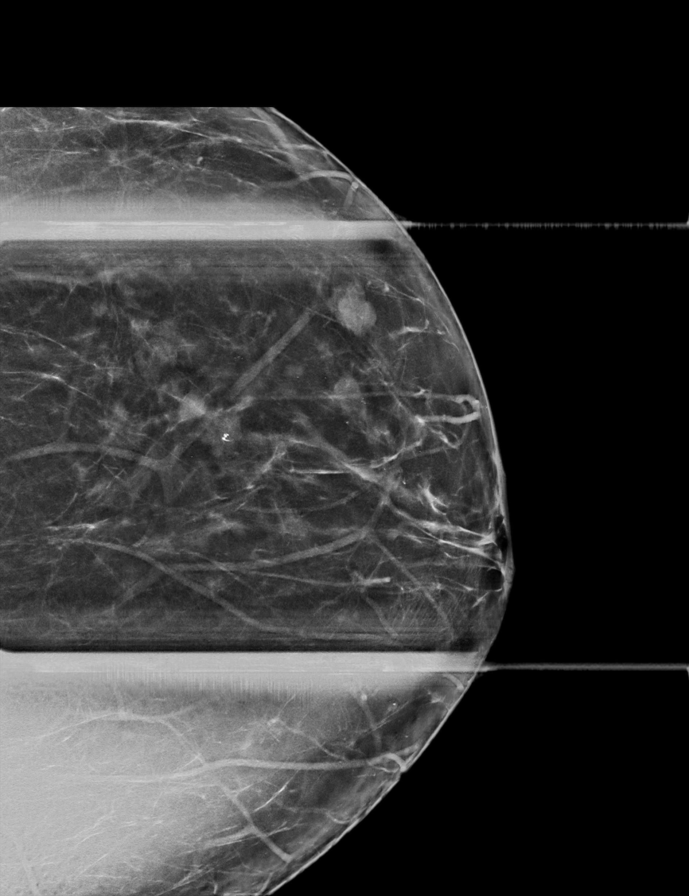

[R MLO synth-2D]
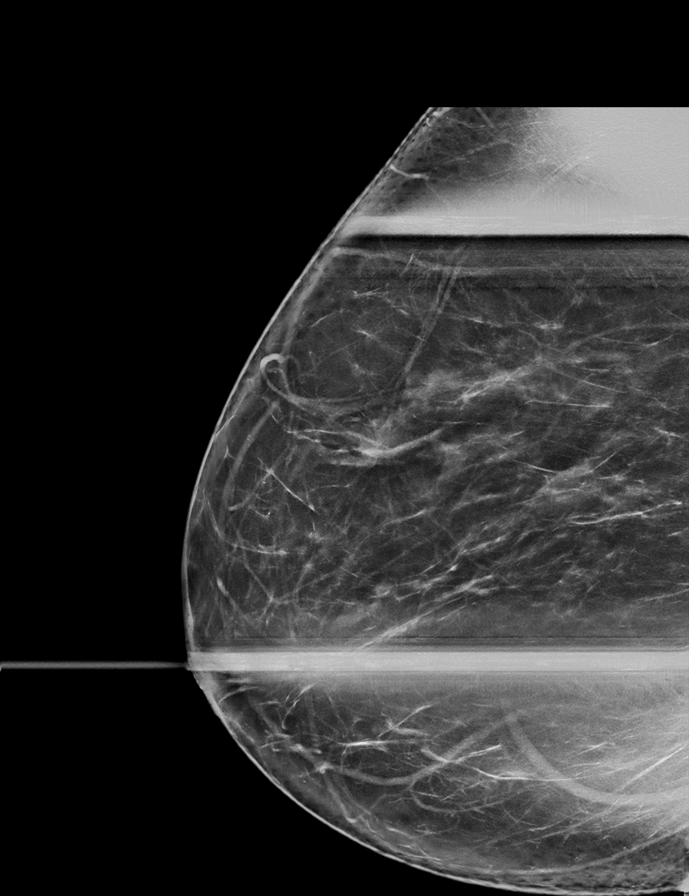

[R CC synth-2D]
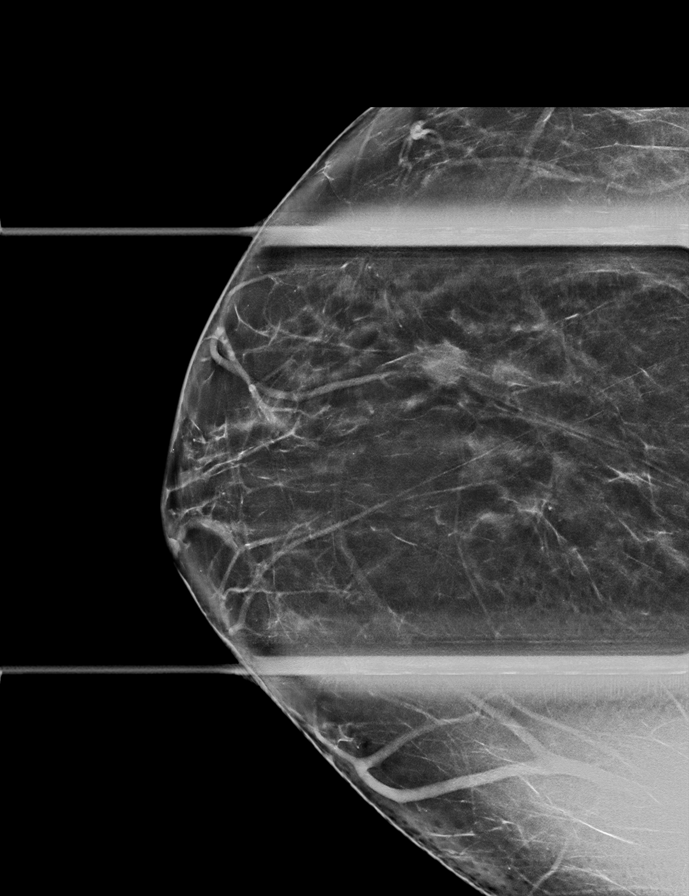

[R ML synth-2D]
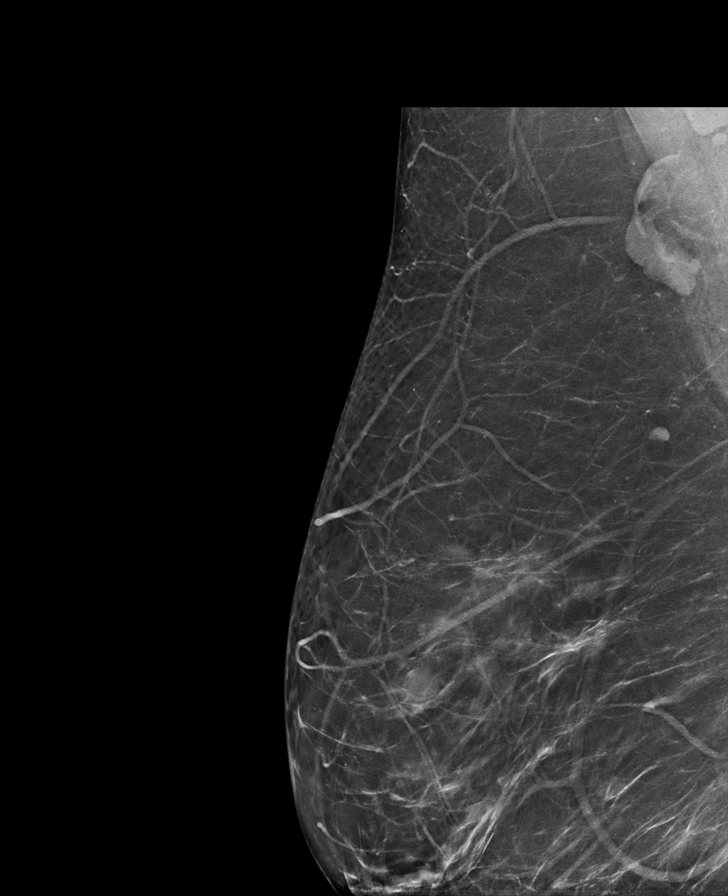

[L MLO synth-2D (2 of 2)]
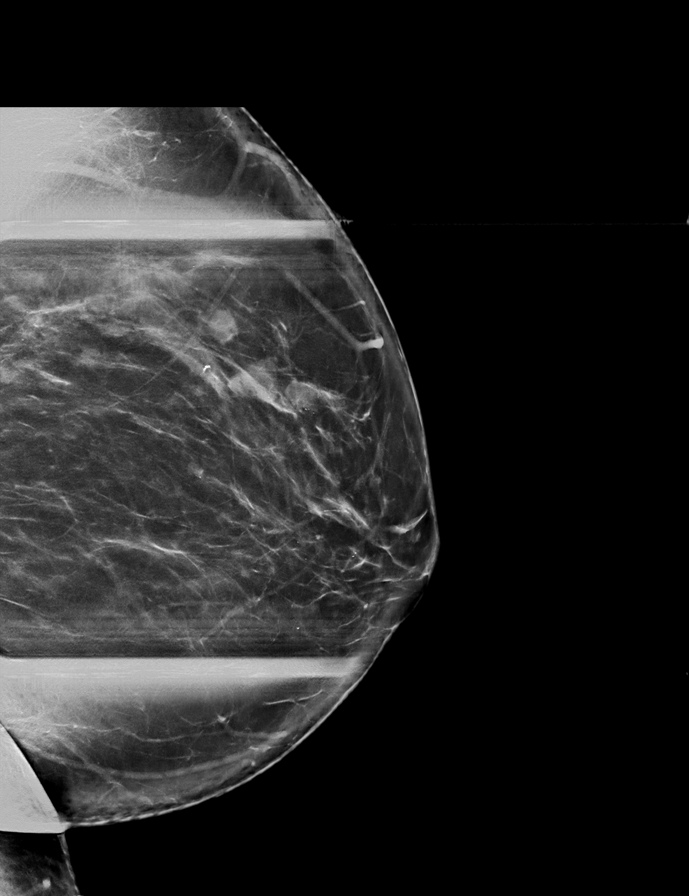

[R MLO tomo · tomo slice 57/84.0]
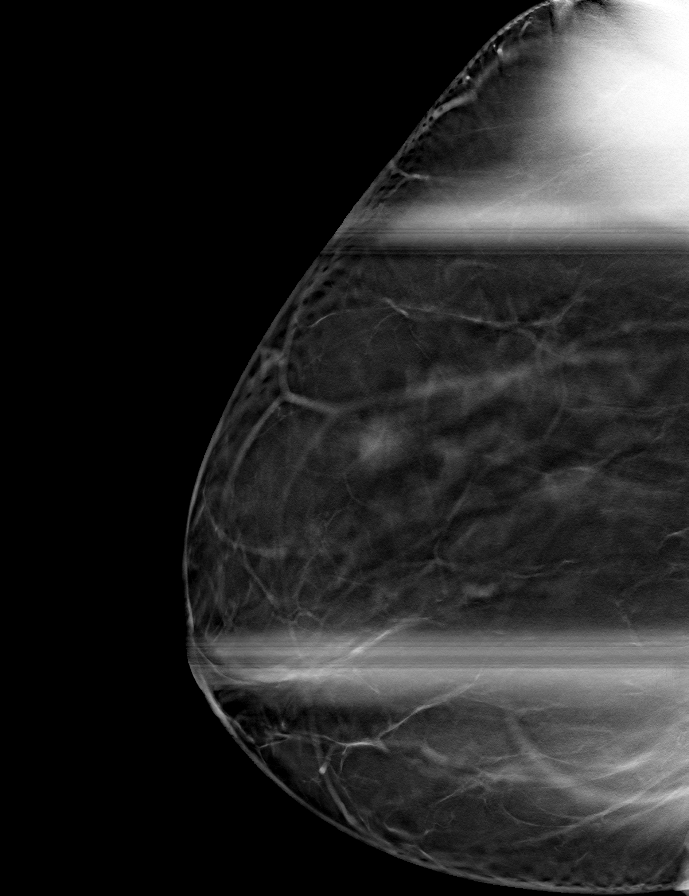

[8 of 40 positions shown; findings below may reference images not displayed]

ACR Breast Density Category b: There are scattered areas of
fibroglandular density.
FINDINGS: RIGHT BREAST:

Mammogram: Additional 2-D and 3-D images are performed. These views
show no persistent distortion in the LATERAL aspect of the RIGHT
breast. These views confirm presence of a mass in the LATERAL aspect
of the breast. Mammographic images were processed with CAD.

Ultrasound: Targeted ultrasound is performed, showing a simple cyst
in the 9:30 o'clock location of the RIGHT breast 4 centimeters from
the nipple measuring 0.9 x 1.0 x 0.8 centimeters.

LEFT BREAST:

Mammogram: Additional 2-D and 3-D images are performed. These views
show no persistent distortion in the LATERAL aspect of the LEFT
breast. These views confirm presence of a mass in the LATERAL aspect
of the breast. Mammographic images were processed with CAD.

Ultrasound: Targeted ultrasound is performed, showing a bilobed cyst
in the 2 o'clock location of the LEFT breast 7 centimeters from the
nipple measuring 1.0 x 0.5 x 1.3 centimeters. No solid component or
areas of acoustic shadowing.
IMPRESSION: 1.  No mammographic or ultrasound evidence for malignancy.
2. Bilateral simple cysts.

RECOMMENDATION:
Screening mammogram in one year.(Code:IN-R-GM6)

I have discussed the findings and recommendations with the patient.
If applicable, a reminder letter will be sent to the patient
regarding the next appointment.

BI-RADS CATEGORY  2: Benign.

## 2022-09-07 IMAGING — US US BREAST*R* LIMITED INC AXILLA
1 series · 6 of 6 positions shown · non-contrast
Comparison: Multiple prior exams, including studies from [facility]
[REDACTED] [REDACTED]

CLINICAL DATA: Patient returns after screening study for evaluation
of possible RIGHT breast distortion and mass and possible LEFT
breast distortion and mass.



[Series 1: us breast*right* limited inc axilla · 0.08mm/px · 6 of 6 slices shown]
[im 1/6]
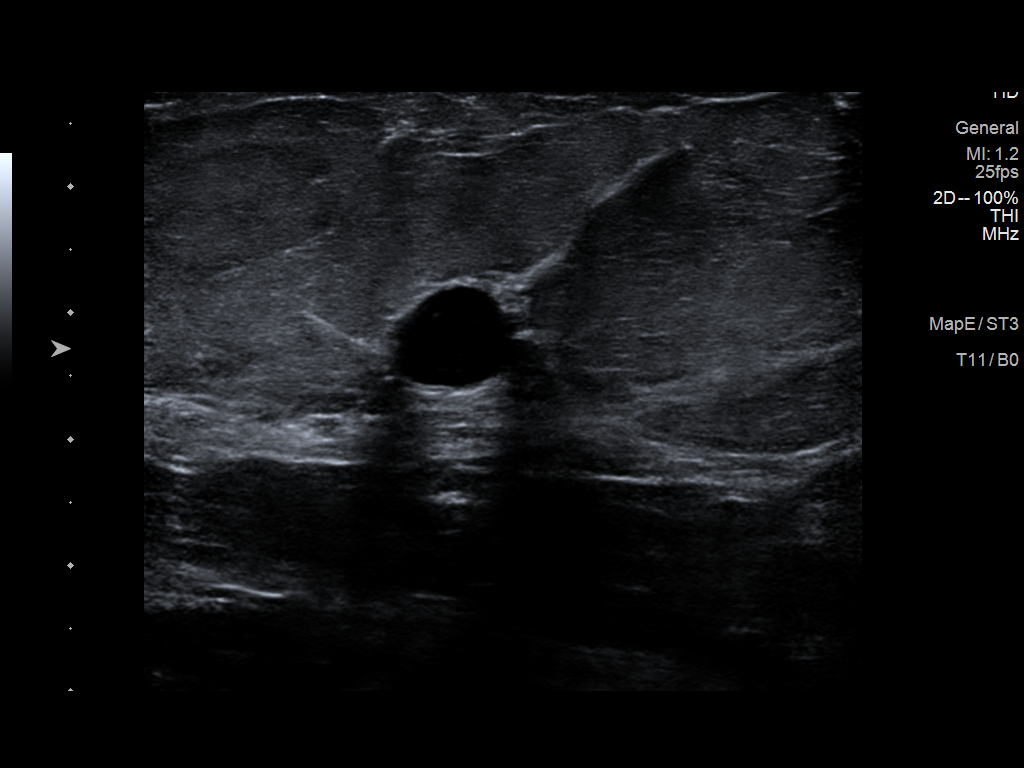
[im 2/6]
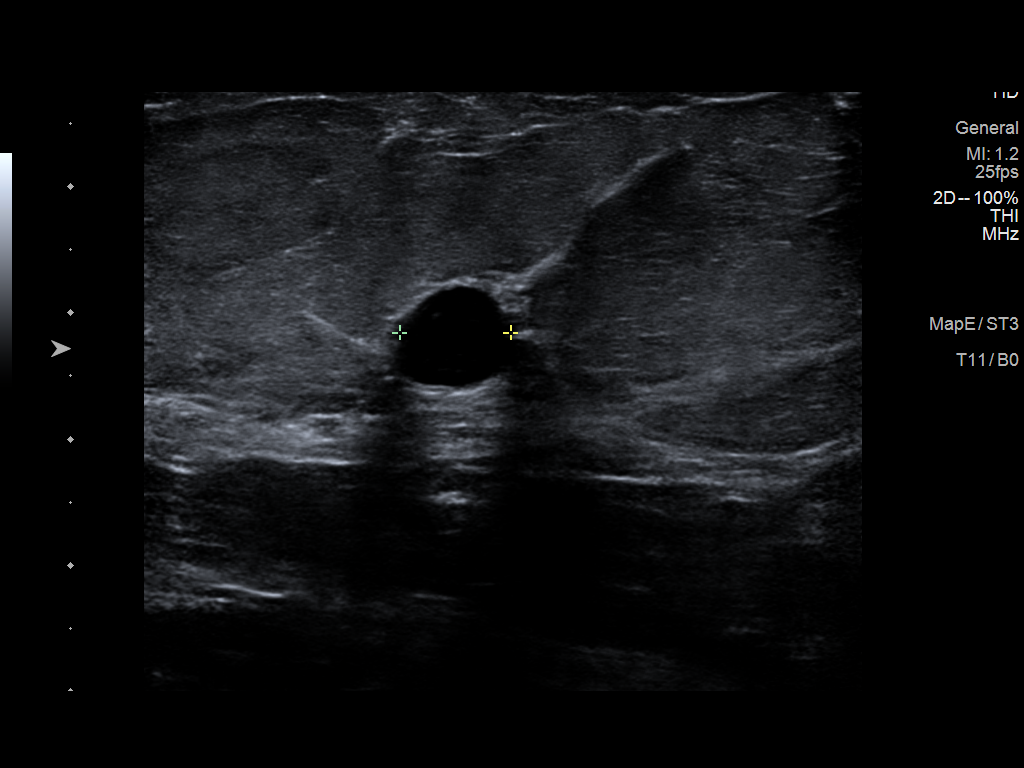
[im 3/6]
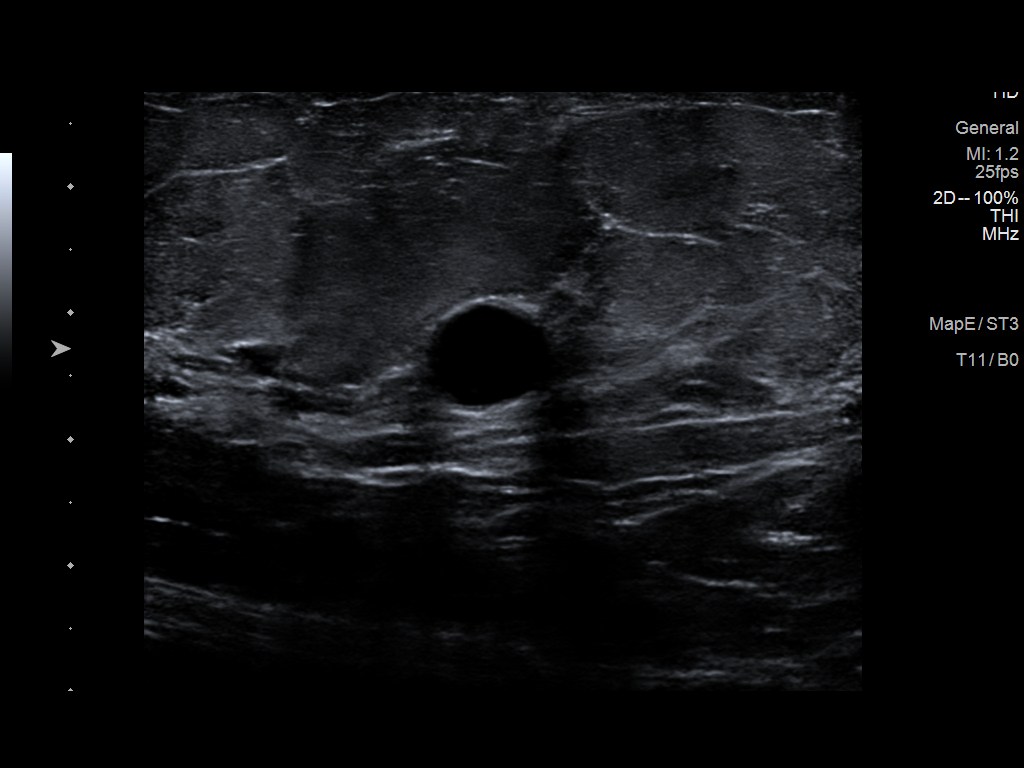
[im 4/6]
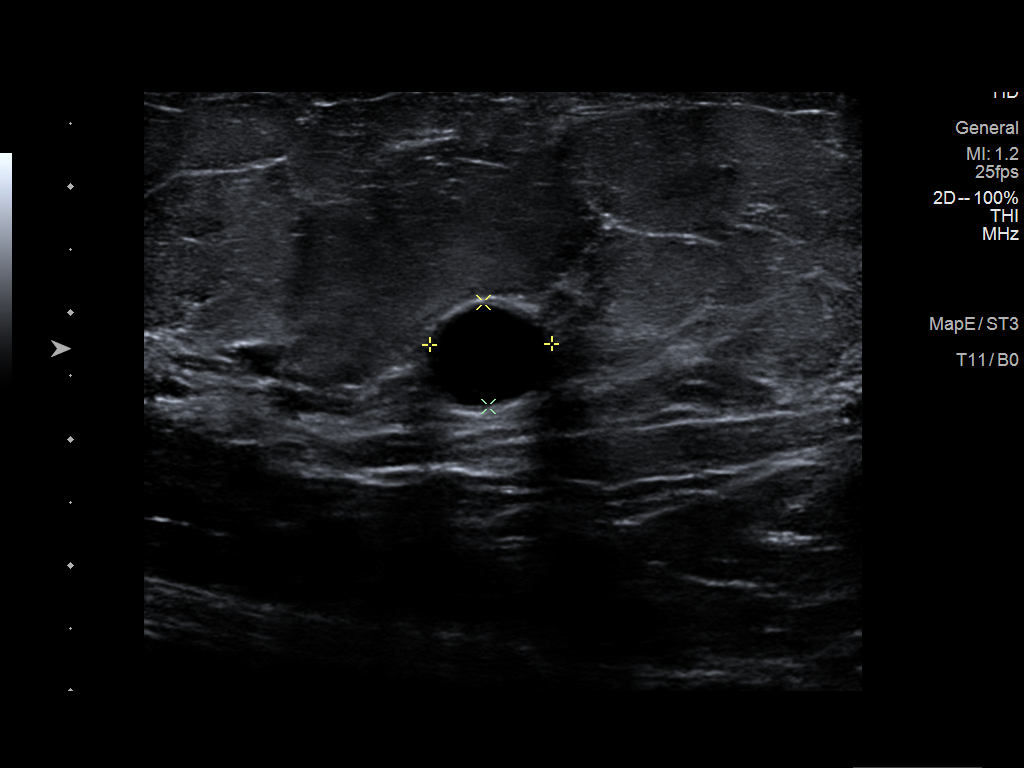
[im 5/6]
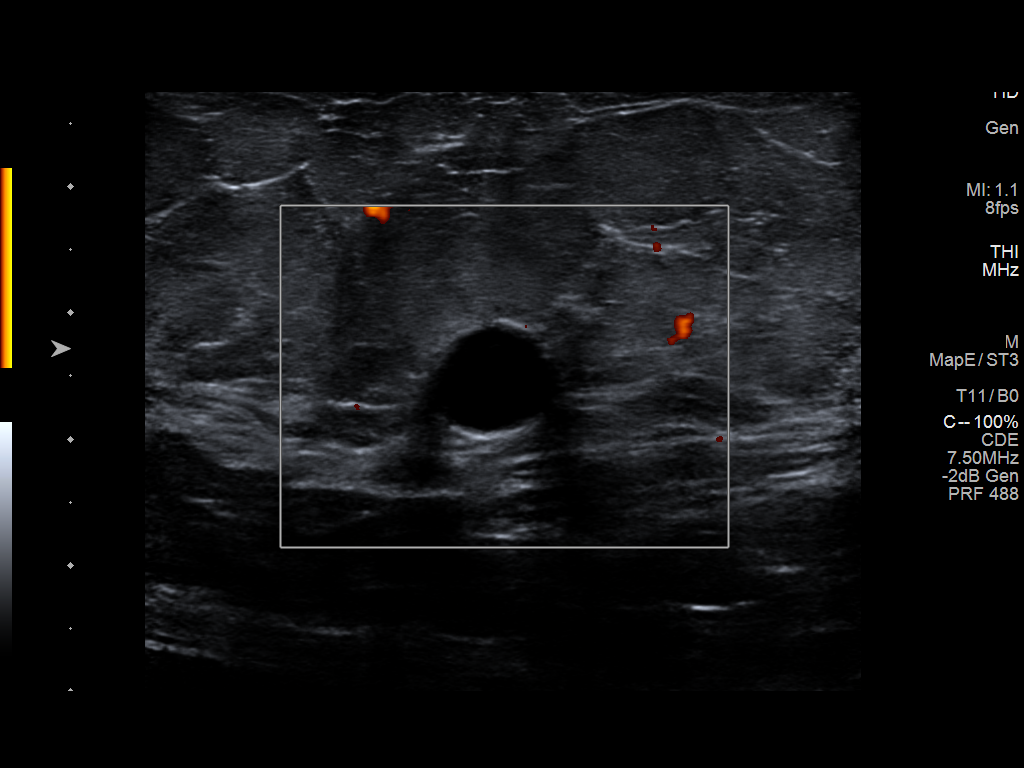
[im 6/6]
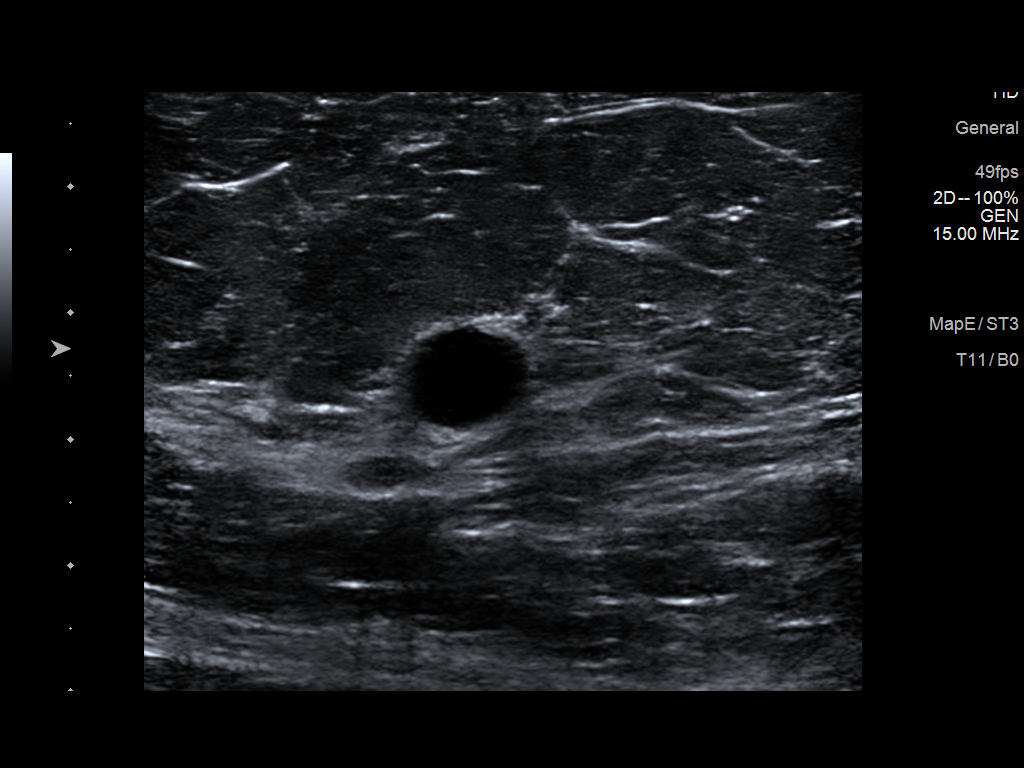

[6 of 6 positions shown; findings below may reference images not displayed]

ACR Breast Density Category b: There are scattered areas of
fibroglandular density.
FINDINGS: RIGHT BREAST:

Mammogram: Additional 2-D and 3-D images are performed. These views
show no persistent distortion in the LATERAL aspect of the RIGHT
breast. These views confirm presence of a mass in the LATERAL aspect
of the breast. Mammographic images were processed with CAD.

Ultrasound: Targeted ultrasound is performed, showing a simple cyst
in the 9:30 o'clock location of the RIGHT breast 4 centimeters from
the nipple measuring 0.9 x 1.0 x 0.8 centimeters.

LEFT BREAST:

Mammogram: Additional 2-D and 3-D images are performed. These views
show no persistent distortion in the LATERAL aspect of the LEFT
breast. These views confirm presence of a mass in the LATERAL aspect
of the breast. Mammographic images were processed with CAD.

Ultrasound: Targeted ultrasound is performed, showing a bilobed cyst
in the 2 o'clock location of the LEFT breast 7 centimeters from the
nipple measuring 1.0 x 0.5 x 1.3 centimeters. No solid component or
areas of acoustic shadowing.
IMPRESSION: 1.  No mammographic or ultrasound evidence for malignancy.
2. Bilateral simple cysts.

RECOMMENDATION:
Screening mammogram in one year.(Code:IN-R-GM6)

I have discussed the findings and recommendations with the patient.
If applicable, a reminder letter will be sent to the patient
regarding the next appointment.

BI-RADS CATEGORY  2: Benign.

## 2022-11-06 ENCOUNTER — Other Ambulatory Visit: Payer: Self-pay | Admitting: Nurse Practitioner

## 2022-11-06 DIAGNOSIS — E039 Hypothyroidism, unspecified: Secondary | ICD-10-CM

## 2022-12-14 ENCOUNTER — Telehealth: Payer: Self-pay | Admitting: Nurse Practitioner

## 2022-12-14 NOTE — Telephone Encounter (Signed)
PT want to TOC to [facility]

## 2022-12-14 NOTE — Telephone Encounter (Signed)
Forwarding

## 2023-01-05 ENCOUNTER — Other Ambulatory Visit: Payer: Self-pay | Admitting: Nurse Practitioner

## 2023-01-05 NOTE — Telephone Encounter (Signed)
Chart supports Rx Last OV: 06/2022 Next OV: 01/2023  

## 2023-01-19 ENCOUNTER — Ambulatory Visit: Payer: Managed Care, Other (non HMO) | Admitting: Nurse Practitioner

## 2023-01-19 ENCOUNTER — Encounter: Payer: Self-pay | Admitting: Nurse Practitioner

## 2023-01-19 ENCOUNTER — Other Ambulatory Visit (HOSPITAL_COMMUNITY)
Admission: RE | Admit: 2023-01-19 | Discharge: 2023-01-19 | Disposition: A | Discharge status: Home / Self Care | Payer: Managed Care, Other (non HMO) | Source: Ambulatory Visit | Attending: Nurse Practitioner | Admitting: Nurse Practitioner

## 2023-01-19 VITALS — BP 130/88 | HR 67 | Temp 97.9°F | Ht 63.0 in | Wt 300.0 lb

## 2023-01-19 DIAGNOSIS — Z0000 Encounter for general adult medical examination without abnormal findings: Secondary | ICD-10-CM

## 2023-01-19 DIAGNOSIS — E559 Vitamin D deficiency, unspecified: Secondary | ICD-10-CM

## 2023-01-19 DIAGNOSIS — Z1231 Encounter for screening mammogram for malignant neoplasm of breast: Secondary | ICD-10-CM

## 2023-01-19 DIAGNOSIS — E039 Hypothyroidism, unspecified: Secondary | ICD-10-CM | POA: Diagnosis not present

## 2023-01-19 DIAGNOSIS — Z136 Encounter for screening for cardiovascular disorders: Secondary | ICD-10-CM | POA: Diagnosis not present

## 2023-01-19 DIAGNOSIS — Z23 Encounter for immunization: Secondary | ICD-10-CM | POA: Diagnosis not present

## 2023-01-19 DIAGNOSIS — Z113 Encounter for screening for infections with a predominantly sexual mode of transmission: Secondary | ICD-10-CM | POA: Insufficient documentation

## 2023-01-19 DIAGNOSIS — F411 Generalized anxiety disorder: Secondary | ICD-10-CM | POA: Insufficient documentation

## 2023-01-19 DIAGNOSIS — Z1211 Encounter for screening for malignant neoplasm of colon: Secondary | ICD-10-CM

## 2023-01-19 LAB — COMPREHENSIVE METABOLIC PANEL
ALT: 42 U/L — ABNORMAL HIGH (ref 0–35)
AST: 23 U/L (ref 0–37)
Albumin: 4.5 g/dL (ref 3.5–5.2)
Alkaline Phosphatase: 68 U/L (ref 39–117)
BUN: 17 mg/dL (ref 6–23)
CO2: 30 mEq/L (ref 19–32)
Calcium: 9.5 mg/dL (ref 8.4–10.5)
Chloride: 102 mEq/L (ref 96–112)
Creatinine, Ser: 0.83 mg/dL (ref 0.40–1.20)
GFR: 80.43 mL/min (ref >60.00)
Glucose, Bld: 97 mg/dL (ref 70–99)
Potassium: 4.2 mEq/L (ref 3.5–5.1)
Sodium: 141 mEq/L (ref 135–145)
Total Bilirubin: 0.5 mg/dL (ref 0.2–1.2)
Total Protein: 7.2 g/dL (ref 6.0–8.3)

## 2023-01-19 LAB — LIPID PANEL
Cholesterol: 199 mg/dL (ref 0–200)
HDL: 37 mg/dL — ABNORMAL LOW (ref >39.00)
LDL Cholesterol: 135 mg/dL — ABNORMAL HIGH (ref 0–99)
NonHDL: 161.79
Total CHOL/HDL Ratio: 5
Triglycerides: 136 mg/dL (ref 0.0–149.0)
VLDL: 27.2 mg/dL (ref 0.0–40.0)

## 2023-01-19 LAB — CBC
HCT: 43.9 % (ref 36.0–46.0)
Hemoglobin: 14.2 g/dL (ref 12.0–15.0)
MCHC: 32.4 g/dL (ref 30.0–36.0)
MCV: 79.3 fl (ref 78.0–100.0)
Platelets: 299 10*3/uL (ref 150.0–400.0)
RBC: 5.54 Mil/uL — ABNORMAL HIGH (ref 3.87–5.11)
RDW: 14.4 % (ref 11.5–15.5)
WBC: 5.8 10*3/uL (ref 4.0–10.5)

## 2023-01-19 LAB — T4, FREE: Free T4: 0.99 ng/dL (ref 0.60–1.60)

## 2023-01-19 LAB — VITAMIN D 25 HYDROXY (VIT D DEFICIENCY, FRACTURES): VITD: 56.48 ng/mL (ref 30.00–100.00)

## 2023-01-19 LAB — TSH: TSH: 3.29 u[IU]/mL (ref 0.35–5.50)

## 2023-01-19 MED ORDER — CITALOPRAM HYDROBROMIDE 40 MG PO TABS: 40.0000 mg | ORAL_TABLET | Freq: Every day | ORAL | 3 refills | Status: DC

## 2023-01-19 NOTE — Patient Instructions (Addendum)
It was great to see you!  See if your insurance covers the low dose CT scan to screen for lung cancer with your smoking history.   I have placed a referral to GI for your colonoscopy  We have you scheduled for your mammogram on 02/27/23 at 4pm at our office on the mobile mammogram bus.   Increase your celexa to 96m daily.   Let's follow-up in 1 year, sooner if you have concerns.  If a referral was placed today, you will be contacted for an appointment. Please note that routine referrals can sometimes take up to 3-4 weeks to process. Please call our office if you haven't heard anything after this time frame.  Take care,  LVance Peper NP

## 2023-01-19 NOTE — Assessment & Plan Note (Signed)
She is currently taking an over-the-counter supplement daily.  Will check vitamin D levels today and adjust regimen based on results.

## 2023-01-19 NOTE — Progress Notes (Signed)
New Patient Visit  BP 130/88 (BP Location: Right Arm)   Pulse 67   Temp 97.9 F (36.6 C)   Ht 5' 3"$  (1.6 m)   Wt 300 lb (136.1 kg)   SpO2 99%   BMI 53.14 kg/m    Subjective:    Patient ID: Kaitlyn Fowler, female    DOB: [birth year], 52 y.o.   MRN: [id number]  CC: Chief Complaint  Patient presents with   [place], no concerns     HPI: Kaitlyn Fowler is a 52 y.o. female presents for new patient visit to establish care.  Introduced to Designer, jewellery role and practice setting.  All questions answered.  Discussed provider/patient relationship and expectations.  She has a history of hypothyroidism. She is taking levothyroxine 156mg daily. She is still having some ongoing fatigue.   She has a history of anxiety. She is taking celexa 218mdaily. She states that some days she does have some anxiety symptoms. She thinks she may need to increase the dose.   She started weight watchers in January and has lost 14 pounds. She walks frequently.      01/19/2023    8:26 AM 11/22/2021    2:38 PM 08/23/2021   10:18 AM  Depression screen PHQ 2/9  Decreased Interest 0 0 0  Down, Depressed, Hopeless 0 0 0  PHQ - 2 Score 0 0 0  Altered sleeping 0 1 3  Tired, decreased energy 3 1 2  $ Change in appetite 0 0 0  Feeling bad or failure about yourself  0 0 0  Trouble concentrating 0 0 0  Moving slowly or fidgety/restless 0 0 0  Suicidal thoughts 0 0 0  PHQ-9 Score 3 2 5  $ Difficult doing work/chores Not difficult at all Not difficult at all Somewhat difficult      01/19/2023    8:28 AM 11/22/2021    2:38 PM 08/23/2021   10:18 AM  GAD 7 : Generalized Anxiety Score  Nervous, Anxious, on Edge 0 0 0  Control/stop worrying 0 0 0  Worry too much - different things 0 0 0  Trouble relaxing 0 0 0  Restless 0 0 0  Easily annoyed or irritable 0 0 0  Afraid - awful might happen 0 0 0  Total GAD 7 Score 0 0 0  Anxiety Difficulty Not difficult at all      Past Medical  History:  Diagnosis Date   Anxiety    Hypothyroid    Vitamin D deficiency     Past Surgical History:  Procedure Laterality Date   EYE SURGERY     HERNIA REPAIR     INCONTINENCE SURGERY      Family History  Adopted: Yes  Problem Relation Age of Onset   Breast cancer Sister      Social History   Tobacco Use   Smoking status: Former    Packs/day: 1.00    Years: 26.00    Total pack years: 26.00    Types: Cigarettes    Quit date: 2014    Years since quitting: 10.1   Smokeless tobacco: Never   Tobacco comments:    Stopped 2018  Vaping Use   Vaping Use: Never used  Substance Use Topics   Alcohol use: Never   Drug use: Never    Current Outpatient Medications on File Prior to Visit  Medication Sig Dispense Refill   Cholecalciferol (VITAMIN D3) 125 MCG (5000 UT) CAPS  Take 1 capsule (5,000 Units total) by mouth daily. 30 capsule    levothyroxine (SYNTHROID) 125 MCG tablet TAKE 1 TABLET BY MOUTH DAILY BEFORE BREAKFAST. 90 tablet 0   No current facility-administered medications on file prior to visit.     Review of Systems  Constitutional:  Positive for fatigue. Negative for fever.  HENT: Negative.    Eyes: Negative.   Respiratory: Negative.    Cardiovascular: Negative.   Gastrointestinal: Negative.   Genitourinary: Negative.   Musculoskeletal: Negative.   Skin: Negative.   Neurological: Negative.   Psychiatric/Behavioral:  Negative for dysphoric mood. The patient is nervous/anxious.       Objective:    BP 130/88 (BP Location: Right Arm)   Pulse 67   Temp 97.9 F (36.6 C)   Ht 5' 3"$  (1.6 m)   Wt 300 lb (136.1 kg)   SpO2 99%   BMI 53.14 kg/m   Wt Readings from Last 3 Encounters:  01/19/23 300 lb (136.1 kg)  06/23/22 (!) 314 lb 12.8 oz (142.8 kg)  11/22/21 (!) 317 lb 9.6 oz (144.1 kg)    BP Readings from Last 3 Encounters:  01/19/23 130/88  06/23/22 133/84  11/22/21 124/84    Physical Exam Vitals and nursing note reviewed.  Constitutional:       General: She is not in acute distress.    Appearance: Normal appearance.  HENT:     Head: Normocephalic and atraumatic.     Right Ear: Tympanic membrane, ear canal and external ear normal.     Left Ear: Tympanic membrane, ear canal and external ear normal.  Eyes:     Conjunctiva/sclera: Conjunctivae normal.  Cardiovascular:     Rate and Rhythm: Normal rate and regular rhythm.     Pulses: Normal pulses.     Heart sounds: Normal heart sounds.  Pulmonary:     Effort: Pulmonary effort is normal.     Breath sounds: Normal breath sounds.  Abdominal:     Palpations: Abdomen is soft.     Tenderness: There is no abdominal tenderness.  Musculoskeletal:        General: Normal range of motion.     Cervical back: Normal range of motion and neck supple.     Right lower leg: No edema.     Left lower leg: No edema.  Lymphadenopathy:     Cervical: No cervical adenopathy.  Skin:    General: Skin is warm and dry.  Neurological:     General: No focal deficit present.     Mental Status: She is alert and oriented to person, place, and time.     Cranial Nerves: No cranial nerve deficit.     Coordination: Coordination normal.     Gait: Gait normal.  Psychiatric:        Mood and Affect: Mood normal.        Behavior: Behavior normal.        Thought Content: Thought content normal.        Judgment: Judgment normal.        Assessment & Plan:   Problem List Items Addressed This Visit       Endocrine   Hypothyroidism    Chronic, stable.  She is taking levothyroxine 125 mcg daily.  Will check TSH and free T4 today and refill medication based on results.      Relevant Orders   TSH   T4, free     Other   Vitamin D deficiency    She  is currently taking an over-the-counter supplement daily.  Will check vitamin D levels today and adjust regimen based on results.      Relevant Orders   VITAMIN D 25 Hydroxy (Vit-D Deficiency, Fractures)   Morbid obesity (HCC)    BMI is 53.1.  She has lost  14 pounds in the last month, congratulated her on this!  She is currently doing weight watchers and walking frequently.  Recommend that she continue this regimen.      Generalized anxiety disorder    Chronic, ongoing.  She has been taking Celexa 20 mg daily for the past several years, however feels like her anxiety is starting to creep back up a little bit again.  Will have her increase her Celexa to 40 mg daily.      Relevant Medications   citalopram (CELEXA) 40 MG tablet   Other Visit Diagnoses     Routine general medical examination at a health care facility    -  Primary   Health maintenance reviewed and updated.  Discussed nutrition, exercise.  Check CMP, CBC today.  Follow-up 1 year   Relevant Orders   CBC   Comprehensive metabolic panel   Screen for colon cancer       Discussed colon cancer screening options and she would like a colonoscopy.  Referral placed to GI.   Relevant Orders   Ambulatory referral to Gastroenterology   Screening for cardiovascular condition       Screen lipid panel today   Relevant Orders   Lipid panel   Encounter for screening mammogram for malignant neoplasm of breast       Mammogram ordered today.  She is scheduled for the mobile bus on 02/27/2023 at 4 PM.   Relevant Orders   MM 3D SCREEN BREAST BILATERAL   Screen for STD (sexually transmitted disease)       Screen for STDs today per patient request   Relevant Orders   HIV Antibody (routine testing w rflx)   RPR   HSV(herpes simplex vrs) 1+2 ab-IgG   Hepatitis C antibody   Urine cytology ancillary only   Immunization due       Shingrix vaccine #1 given today.   Relevant Orders   Varicella-zoster vaccine IM (Completed)       LABORATORY TESTING:  - Pap smear: up to date  IMMUNIZATIONS:   - Tdap: Tetanus vaccination status reviewed: last tetanus booster within 10 years. - Influenza: Refused - Pneumovax: Not applicable - Prevnar: Not applicable - HPV: Not applicable - Zostavax vaccine:  Administered today  SCREENING: -Mammogram: Ordered today  - Colonoscopy: Ordered today  - Bone Density: Not applicable  -Hearing Test: Not applicable  -Spirometry: Not applicable -Lung Cancer Screen: She will check with insurance to see if this is covered    PATIENT COUNSELING:   Advised to take 1 mg of folate supplement per day if capable of pregnancy.   Sexuality: Discussed sexually transmitted diseases, partner selection, use of condoms, avoidance of unintended pregnancy  and contraceptive alternatives.   Advised to avoid cigarette smoking.  I discussed with the patient that most people either abstain from alcohol or drink within safe limits (<=14/week and <=4 drinks/occasion for males, <=7/weeks and <= 3 drinks/occasion for females) and that the risk for alcohol disorders and other health effects rises proportionally with the number of drinks per week and how often a drinker exceeds daily limits.  Discussed cessation/primary prevention of drug use and availability of treatment for abuse.  Diet: Encouraged to adjust caloric intake to maintain  or achieve ideal body weight, to reduce intake of dietary saturated fat and total fat, to limit sodium intake by avoiding high sodium foods and not adding table salt, and to maintain adequate dietary potassium and calcium preferably from fresh fruits, vegetables, and low-fat dairy products.    stressed the importance of regular exercise  Injury prevention: Discussed safety belts, safety helmets, smoke detector, smoking near bedding or upholstery.   Dental health: Discussed importance of regular tooth brushing, flossing, and dental visits.    NEXT PREVENTATIVE PHYSICAL DUE IN 1 YEAR.  Follow up plan: Return in about 1 year (around 01/20/2024) for CPE.

## 2023-01-19 NOTE — Assessment & Plan Note (Signed)
BMI is 53.1.  She has lost 14 pounds in the last month, congratulated her on this!  She is currently doing weight watchers and walking frequently.  Recommend that she continue this regimen.

## 2023-01-19 NOTE — Assessment & Plan Note (Signed)
Chronic, ongoing.  She has been taking Celexa 20 mg daily for the past several years, however feels like her anxiety is starting to creep back up a little bit again.  Will have her increase her Celexa to 40 mg daily.

## 2023-01-19 NOTE — Assessment & Plan Note (Signed)
Chronic, stable.  She is taking levothyroxine 125 mcg daily.  Will check TSH and free T4 today and refill medication based on results.

## 2023-01-20 LAB — RPR: RPR Ser Ql: NONREACTIVE

## 2023-01-20 LAB — URINE CYTOLOGY ANCILLARY ONLY
Chlamydia: NEGATIVE
Comment: NEGATIVE
Comment: NORMAL
Neisseria Gonorrhea: NEGATIVE

## 2023-01-20 LAB — HSV(HERPES SIMPLEX VRS) I + II AB-IGG
HAV 1 IGG,TYPE SPECIFIC AB: 44.1 index — ABNORMAL HIGH
HSV 2 IGG,TYPE SPECIFIC AB: <0.9 index

## 2023-01-20 LAB — HEPATITIS C ANTIBODY: Hepatitis C Ab: NONREACTIVE

## 2023-01-20 LAB — HIV ANTIBODY (ROUTINE TESTING W REFLEX): HIV 1&2 Ab, 4th Generation: NONREACTIVE

## 2023-01-20 MED ORDER — LEVOTHYROXINE SODIUM 125 MCG PO TABS: 125.0000 ug | ORAL_TABLET | Freq: Every day | ORAL | 3 refills | Status: DC

## 2023-01-20 NOTE — Addendum Note (Signed)
Addended by: Vance Peper A on: 01/20/2023 02:05 PM   Modules accepted: Orders

## 2023-02-20 ENCOUNTER — Encounter: Payer: Self-pay | Admitting: Gastroenterology

## 2023-02-27 ENCOUNTER — Ambulatory Visit
Admission: RE | Admit: 2023-02-27 | Discharge: 2023-02-27 | Disposition: A | Discharge status: Home / Self Care | Payer: Managed Care, Other (non HMO) | Source: Ambulatory Visit | Attending: Nurse Practitioner | Admitting: Nurse Practitioner

## 2023-02-27 DIAGNOSIS — Z1231 Encounter for screening mammogram for malignant neoplasm of breast: Secondary | ICD-10-CM

## 2023-02-28 ENCOUNTER — Telehealth: Payer: Self-pay

## 2023-02-28 NOTE — Telephone Encounter (Signed)
Dr. Loletha Carrow,  Patients BMI 53.16 as of last month. OV or direct to hospital? Please advise.

## 2023-02-28 NOTE — Telephone Encounter (Signed)
[facility],   Please see attached messages. I will call and inform the patient that her PV and colonoscopy will be cancelled until we have a spot available for her at one of the hospitals. Thank you.

## 2023-02-28 NOTE — Telephone Encounter (Signed)
It has to go on my waiting list for hospital procedures.  My next few months of hospital outpatient endoscopy blocks are needed for diagnostic procedures.  - HD

## 2023-03-01 NOTE — Telephone Encounter (Signed)
Patient has been added to Dr. Danis' hospital wait list 

## 2023-03-21 ENCOUNTER — Ambulatory Visit (INDEPENDENT_AMBULATORY_CARE_PROVIDER_SITE_OTHER): Payer: Managed Care, Other (non HMO)

## 2023-03-21 DIAGNOSIS — Z23 Encounter for immunization: Secondary | ICD-10-CM

## 2023-03-21 NOTE — Progress Notes (Signed)
After obtaining consent, and per orders of Lauren McElwee, injection of Shingrix given IM in the left deltoid by Pamala Hurry [place]. Patient instructed to remain in clinic for 20 minutes afterwards, and to report any adverse reaction to me immediately.

## 2023-04-04 ENCOUNTER — Encounter: Payer: Managed Care, Other (non HMO) | Admitting: Gastroenterology

## 2023-04-06 ENCOUNTER — Encounter: Payer: Managed Care, Other (non HMO) | Admitting: Gastroenterology

## 2023-07-26 ENCOUNTER — Other Ambulatory Visit (HOSPITAL_COMMUNITY)
Admission: RE | Admit: 2023-07-26 | Discharge: 2023-07-26 | Disposition: A | Discharge status: Home / Self Care | Payer: Managed Care, Other (non HMO) | Source: Ambulatory Visit | Attending: Nurse Practitioner | Admitting: Nurse Practitioner

## 2023-07-26 ENCOUNTER — Encounter: Payer: Self-pay | Admitting: Nurse Practitioner

## 2023-07-26 ENCOUNTER — Ambulatory Visit (INDEPENDENT_AMBULATORY_CARE_PROVIDER_SITE_OTHER): Payer: Managed Care, Other (non HMO) | Admitting: Nurse Practitioner

## 2023-07-26 VITALS — BP 124/82 | HR 63 | Temp 97.2°F | Ht 63.0 in | Wt 273.2 lb

## 2023-07-26 DIAGNOSIS — Z87891 Personal history of nicotine dependence: Secondary | ICD-10-CM

## 2023-07-26 DIAGNOSIS — E039 Hypothyroidism, unspecified: Secondary | ICD-10-CM

## 2023-07-26 DIAGNOSIS — E7800 Pure hypercholesterolemia, unspecified: Secondary | ICD-10-CM | POA: Diagnosis not present

## 2023-07-26 DIAGNOSIS — E559 Vitamin D deficiency, unspecified: Secondary | ICD-10-CM

## 2023-07-26 DIAGNOSIS — K130 Diseases of lips: Secondary | ICD-10-CM

## 2023-07-26 DIAGNOSIS — Z113 Encounter for screening for infections with a predominantly sexual mode of transmission: Secondary | ICD-10-CM | POA: Diagnosis present

## 2023-07-26 LAB — COMPREHENSIVE METABOLIC PANEL
ALT: 27 U/L (ref 0–35)
AST: 19 U/L (ref 0–37)
Albumin: 4.4 g/dL (ref 3.5–5.2)
Alkaline Phosphatase: 69 U/L (ref 39–117)
BUN: 17 mg/dL (ref 6–23)
CO2: 29 mEq/L (ref 19–32)
Calcium: 9.3 mg/dL (ref 8.4–10.5)
Chloride: 101 meq/L (ref 96–112)
Creatinine, Ser: 0.73 mg/dL (ref 0.40–1.20)
GFR: 93.49 mL/min (ref >60.00)
Glucose, Bld: 93 mg/dL (ref 70–99)
Potassium: 4.6 meq/L (ref 3.5–5.1)
Sodium: 136 meq/L (ref 135–145)
Total Bilirubin: 0.6 mg/dL (ref 0.2–1.2)
Total Protein: 7 g/dL (ref 6.0–8.3)

## 2023-07-26 LAB — LIPID PANEL
Cholesterol: 215 mg/dL — ABNORMAL HIGH (ref 0–200)
HDL: 36.9 mg/dL — ABNORMAL LOW (ref >39.00)
LDL Cholesterol: 143 mg/dL — ABNORMAL HIGH (ref 0–99)
NonHDL: 178.18
Total CHOL/HDL Ratio: 6
Triglycerides: 174 mg/dL — ABNORMAL HIGH (ref 0.0–149.0)
VLDL: 34.8 mg/dL (ref 0.0–40.0)

## 2023-07-26 LAB — CBC WITH DIFFERENTIAL/PLATELET
Basophils Absolute: 0 10*3/uL (ref 0.0–0.1)
Basophils Relative: 0.5 % (ref 0.0–3.0)
Eosinophils Absolute: 0.1 10*3/uL (ref 0.0–0.7)
Eosinophils Relative: 1.8 % (ref 0.0–5.0)
HCT: 43 % (ref 36.0–46.0)
Hemoglobin: 13.7 g/dL (ref 12.0–15.0)
Lymphocytes Relative: 32.4 % (ref 12.0–46.0)
Lymphs Abs: 1.9 10*3/uL (ref 0.7–4.0)
MCHC: 31.8 g/dL (ref 30.0–36.0)
MCV: 81 fl (ref 78.0–100.0)
Monocytes Absolute: 0.4 10*3/uL (ref 0.1–1.0)
Monocytes Relative: 6 % (ref 3.0–12.0)
Neutro Abs: 3.4 10*3/uL (ref 1.4–7.7)
Neutrophils Relative %: 59.3 % (ref 43.0–77.0)
Platelets: 281 10*3/uL (ref 150.0–400.0)
RBC: 5.31 Mil/uL — ABNORMAL HIGH (ref 3.87–5.11)
RDW: 14.8 % (ref 11.5–15.5)
WBC: 5.8 10*3/uL (ref 4.0–10.5)

## 2023-07-26 MED ORDER — CLOTRIMAZOLE 1 % EX CREA: 1.0000 | TOPICAL_CREAM | Freq: Two times a day (BID) | CUTANEOUS | 0 refills | Status: AC

## 2023-07-26 NOTE — Assessment & Plan Note (Signed)
She has lost 43.5 pounds in the last 9 months, congratulated her on this!  Continue following weight watchers and slowly start to increase exercise when able.

## 2023-07-26 NOTE — Assessment & Plan Note (Signed)
She has a history of smoking with an approximately 23-pack-year history.  She quit 13 years ago.  She is interested in the lung cancer screening, order placed today.

## 2023-07-26 NOTE — Assessment & Plan Note (Signed)
Chronic, stable.  She is currently taking an over-the-counter supplement daily.  Check vitamin D levels and adjust regimen based on results.

## 2023-07-26 NOTE — Assessment & Plan Note (Signed)
Chronic, stable.  With her recent weight loss, will recheck TSH today and adjust regimen based on results.

## 2023-07-26 NOTE — Assessment & Plan Note (Signed)
LDL was slightly elevated last visit at 135.  ASCVD score is 2.5%.  Will recheck CMP, CBC, lipid panel today with recent weight loss.

## 2023-07-26 NOTE — Progress Notes (Signed)
Established Patient Office Visit  Subjective   Patient ID: Kaitlyn Fowler, female    DOB: October 25, 1971  Age: 52 y.o. MRN: [id number]  Chief Complaint  Patient presents with   Medication Management    Discuss having lab work    HPI  Kaitlyn Fowler is here to follow-up on hypothyroid and hyperlipidemia.  She states that she has been doing well.  She has been taking her medications regularly.  She has joined [employer] and has lost 43.5 pounds since December.  She states that she has not been exercising as much since it has been hot outside.  She is interested in having her lab work rechecked.  She is also interested in STD testing.  She states that she has a rash around her mouth for the last 2 months.  She thought it was from a new toothpaste, however it is still there.  She has not tried anything at home for her symptoms.  It does not hurt or itch.    ROS See pertinent positives and negatives per HPI.    Objective:     BP 124/82 (BP Location: Left Arm)   Pulse 63   Temp (!) 97.2 F (36.2 C)   Ht 5\' 3"  (1.6 m)   Wt 273 lb 3.2 oz (123.9 kg)   SpO2 97%   BMI 48.40 kg/m  BP Readings from Last 3 Encounters:  07/26/23 124/82  01/19/23 130/88  06/23/22 133/84   Wt Readings from Last 3 Encounters:  07/26/23 273 lb 3.2 oz (123.9 kg)  01/19/23 300 lb (136.1 kg)  06/23/22 (!) 314 lb 12.8 oz (142.8 kg)      Physical Exam Vitals and nursing note reviewed.  Constitutional:      General: She is not in acute distress.    Appearance: Normal appearance.  HENT:     Head: Normocephalic.  Eyes:     Conjunctiva/sclera: Conjunctivae normal.  Cardiovascular:     Rate and Rhythm: Normal rate and regular rhythm.     Pulses: Normal pulses.     Heart sounds: Normal heart sounds.  Pulmonary:     Effort: Pulmonary effort is normal.     Breath sounds: Normal breath sounds.  Musculoskeletal:     Cervical back: Normal range of motion.  Skin:    General: Skin is warm.   Neurological:     General: No focal deficit present.     Mental Status: She is alert and oriented to person, place, and time.  Psychiatric:        Mood and Affect: Mood normal.        Behavior: Behavior normal.        Thought Content: Thought content normal.        Judgment: Judgment normal.    The 10-year ASCVD risk score (Arnett DK, et al., 2019) is: 2.5%    Assessment & Plan:   Problem List Items Addressed This Visit       Endocrine   Hypothyroidism - Primary    Chronic, stable.  With her recent weight loss, will recheck TSH today and adjust regimen based on results.      Relevant Orders   TSH     Other   Vitamin D deficiency    Chronic, stable.  She is currently taking an over-the-counter supplement daily.  Check vitamin D levels and adjust regimen based on results.      Relevant Orders   VITAMIN D 25 Hydroxy (Vit-D Deficiency, Fractures)  Morbid obesity (HCC)    She has lost 43.5 pounds in the last 9 months, congratulated her on this!  Continue following weight watchers and slowly start to increase exercise when able.      History of tobacco use    She has a history of smoking with an approximately 23-pack-year history.  She quit 13 years ago.  She is interested in the lung cancer screening, order placed today.      Relevant Orders   CT CHEST LUNG CANCER SCREENING LOW DOSE WO CONTRAST   Pure hypercholesterolemia    LDL was slightly elevated last visit at 135.  ASCVD score is 2.5%.  Will recheck CMP, CBC, lipid panel today with recent weight loss.      Relevant Orders   CBC with Differential/Platelet   Comprehensive metabolic panel   Lipid panel   Other Visit Diagnoses     Screen for STD (sexually transmitted disease)       Screen STDs per patient request.  Currently not having any symptoms.   Relevant Orders   HIV Antibody (routine testing w rflx)   Urine cytology ancillary only   Angular cheilitis       Start clotrimazole cream twice a day to  affected areas.       Return in about 6 months (around 01/26/2024) for CPE.    Gerre Scull, NP

## 2023-07-26 NOTE — Patient Instructions (Signed)
It was great to see you!  You can try black kohosh supplement daily for hot flashes.   The number to call for your colonoscopy is: [phone]  I have ordered a low dose CT to screen for lung cancer.   Start clotrimazole cream twice a day to your chin  Let's follow-up in 6 months, sooner if you have concerns.  If a referral was placed today, you will be contacted for an appointment. Please note that routine referrals can sometimes take up to 3-4 weeks to process. Please call our office if you haven't heard anything after this time frame.  Take care,  Rodman Pickle, NP

## 2023-07-27 LAB — URINE CYTOLOGY ANCILLARY ONLY
Chlamydia: NEGATIVE
Comment: NEGATIVE
Comment: NORMAL
Neisseria Gonorrhea: NEGATIVE

## 2023-07-27 LAB — HIV ANTIBODY (ROUTINE TESTING W REFLEX): HIV 1&2 Ab, 4th Generation: NONREACTIVE

## 2023-07-28 LAB — VITAMIN D 25 HYDROXY (VIT D DEFICIENCY, FRACTURES): VITD: 65.45 ng/mL (ref 30.00–100.00)

## 2023-07-28 LAB — TSH: TSH: 3.49 u[IU]/mL (ref 0.35–5.50)

## 2023-08-03 ENCOUNTER — Telehealth: Payer: Self-pay

## 2023-08-03 NOTE — Telephone Encounter (Signed)
Lm on vm for patient to return call to discuss scheduling screening colonoscopy at [facility] with Dr. Myrtie Neither in October.

## 2023-08-08 NOTE — Telephone Encounter (Signed)
No return call received.  

## 2023-10-09 ENCOUNTER — Encounter: Payer: Self-pay | Admitting: Nurse Practitioner

## 2023-10-31 ENCOUNTER — Telehealth: Payer: Self-pay

## 2023-10-31 NOTE — Telephone Encounter (Signed)
Pt was made aware of the availability on 11/09/2023 for her colonoscopy to be completed. Pt stated that she was not available on that date.  Pt notified that we will keep her on the wait list. Pt verbalized understanding with all questions answered.

## 2023-11-22 ENCOUNTER — Other Ambulatory Visit: Payer: Self-pay | Admitting: Nurse Practitioner

## 2023-11-22 DIAGNOSIS — E039 Hypothyroidism, unspecified: Secondary | ICD-10-CM

## 2023-11-22 NOTE — Telephone Encounter (Signed)
Requesting: LEVOTHYROXINE 125 MCG TABLET  Last Visit: 07/26/2023 Next Visit: Visit date not found Last Refill: 01/20/2023  Please Advise

## 2023-12-21 ENCOUNTER — Other Ambulatory Visit: Payer: Self-pay | Admitting: Nurse Practitioner

## 2024-03-12 ENCOUNTER — Encounter: Payer: Self-pay | Admitting: Nurse Practitioner

## 2024-03-12 ENCOUNTER — Telehealth: Payer: Self-pay

## 2024-03-12 ENCOUNTER — Ambulatory Visit (INDEPENDENT_AMBULATORY_CARE_PROVIDER_SITE_OTHER): Payer: Managed Care, Other (non HMO) | Admitting: Nurse Practitioner

## 2024-03-12 VITALS — BP 122/86 | HR 60 | Temp 97.7°F | Ht 63.0 in | Wt 275.0 lb

## 2024-03-12 DIAGNOSIS — Z0001 Encounter for general adult medical examination with abnormal findings: Secondary | ICD-10-CM | POA: Insufficient documentation

## 2024-03-12 DIAGNOSIS — E782 Mixed hyperlipidemia: Secondary | ICD-10-CM

## 2024-03-12 DIAGNOSIS — E559 Vitamin D deficiency, unspecified: Secondary | ICD-10-CM

## 2024-03-12 DIAGNOSIS — J069 Acute upper respiratory infection, unspecified: Secondary | ICD-10-CM

## 2024-03-12 DIAGNOSIS — E039 Hypothyroidism, unspecified: Secondary | ICD-10-CM | POA: Diagnosis not present

## 2024-03-12 DIAGNOSIS — F411 Generalized anxiety disorder: Secondary | ICD-10-CM

## 2024-03-12 LAB — CBC WITH DIFFERENTIAL/PLATELET
Basophils Absolute: 0 10*3/uL (ref 0.0–0.1)
Basophils Relative: 0.5 % (ref 0.0–3.0)
Eosinophils Absolute: 0.2 10*3/uL (ref 0.0–0.7)
Eosinophils Relative: 4.9 % (ref 0.0–5.0)
HCT: 37.1 % (ref 36.0–46.0)
Hemoglobin: 12 g/dL (ref 12.0–15.0)
Lymphocytes Relative: 31.5 % (ref 12.0–46.0)
Lymphs Abs: 1.5 10*3/uL (ref 0.7–4.0)
MCHC: 32.2 g/dL (ref 30.0–36.0)
MCV: 81.7 fl (ref 78.0–100.0)
Monocytes Absolute: 0.3 10*3/uL (ref 0.1–1.0)
Monocytes Relative: 6.7 % (ref 3.0–12.0)
Neutro Abs: 2.8 10*3/uL (ref 1.4–7.7)
Neutrophils Relative %: 56.4 % (ref 43.0–77.0)
Platelets: 250 10*3/uL (ref 150.0–400.0)
RBC: 4.54 Mil/uL (ref 3.87–5.11)
RDW: 13.9 % (ref 11.5–15.5)
WBC: 4.9 10*3/uL (ref 4.0–10.5)

## 2024-03-12 LAB — LIPID PANEL
Cholesterol: 188 mg/dL (ref 0–200)
HDL: 35.9 mg/dL — ABNORMAL LOW (ref >39.00)
LDL Cholesterol: 115 mg/dL — ABNORMAL HIGH (ref 0–99)
NonHDL: 152.4
Total CHOL/HDL Ratio: 5
Triglycerides: 186 mg/dL — ABNORMAL HIGH (ref 0.0–149.0)
VLDL: 37.2 mg/dL (ref 0.0–40.0)

## 2024-03-12 LAB — VITAMIN D 25 HYDROXY (VIT D DEFICIENCY, FRACTURES): VITD: 46.94 ng/mL (ref 30.00–100.00)

## 2024-03-12 LAB — COMPREHENSIVE METABOLIC PANEL WITH GFR
ALT: 15 U/L (ref 0–35)
AST: 14 U/L (ref 0–37)
Albumin: 4.2 g/dL (ref 3.5–5.2)
Alkaline Phosphatase: 63 U/L (ref 39–117)
BUN: 16 mg/dL (ref 6–23)
CO2: 28 meq/L (ref 19–32)
Calcium: 8.7 mg/dL (ref 8.4–10.5)
Chloride: 104 meq/L (ref 96–112)
Creatinine, Ser: 0.65 mg/dL (ref 0.40–1.20)
GFR: 99.64 mL/min (ref >60.00)
Glucose, Bld: 96 mg/dL (ref 70–99)
Potassium: 4.2 meq/L (ref 3.5–5.1)
Sodium: 140 meq/L (ref 135–145)
Total Bilirubin: 0.3 mg/dL (ref 0.2–1.2)
Total Protein: 6.3 g/dL (ref 6.0–8.3)

## 2024-03-12 LAB — TSH: TSH: 3.72 u[IU]/mL (ref 0.35–5.50)

## 2024-03-12 LAB — HEMOGLOBIN A1C: Hgb A1c MFr Bld: 5.8 % (ref 4.6–6.5)

## 2024-03-12 MED ORDER — ORPHENADRINE CITRATE ER 100 MG PO TB12: 100.0000 mg | ORAL_TABLET | Freq: Two times a day (BID) | ORAL | 0 refills | Status: AC | PRN

## 2024-03-12 MED ORDER — PROMETHAZINE-DM 6.25-15 MG/5ML PO SYRP: 5.0000 mL | ORAL_SOLUTION | Freq: Four times a day (QID) | ORAL | 0 refills | Status: AC | PRN

## 2024-03-12 NOTE — Assessment & Plan Note (Addendum)
 BMI 48.7. She has plateaued with her weight but is still doing weight watchers. She is going to start exercising again. Check A1c today.

## 2024-03-12 NOTE — Assessment & Plan Note (Signed)
Chronic, stable. Continue celexa 40mg daily. 

## 2024-03-12 NOTE — Assessment & Plan Note (Signed)
 Chronic, stable. Check CMP, CBC, A1c and treat based on results.

## 2024-03-12 NOTE — Patient Instructions (Signed)
 It was great to see you!  We are checking your labs today and will let you know the results via mychart/phone.   Start cough syrup every 4 hours as needed for cough   Drink plenty of fluids and get rest   Let's follow-up in 1 year, sooner if you have concerns.  If a referral was placed today, you will be contacted for an appointment. Please note that routine referrals can sometimes take up to 3-4 weeks to process. Please call our office if you haven't heard anything after this time frame.  Take care,  Rodman Pickle, NP

## 2024-03-12 NOTE — Telephone Encounter (Signed)
 I called and spoke with receptionist and patient has not been seen there at [facility]. If patient is wanting to have a colonoscopy done at the hospital then she will need to be seen in the office first and then the physician at the GI office can get her scheduled to have a colonoscopy done at the hospital.

## 2024-03-12 NOTE — Assessment & Plan Note (Signed)
Health maintenance reviewed and updated. Discussed nutrition, exercise. Follow-up 1 year.

## 2024-03-12 NOTE — Assessment & Plan Note (Signed)
 Chronic, stable. Continue levothyroxine daily. Check TSH and adjust regimen based on results.

## 2024-03-12 NOTE — Progress Notes (Signed)
 BP 122/86 (BP Location: Left Arm, Patient Position: Sitting, Cuff Size: Large)   Pulse 60   Temp 97.7 F (36.5 C) (Oral)   Ht 5\' 3"  (1.6 m)   Wt 275 lb (124.7 kg)   SpO2 97%   BMI 48.71 kg/m    Subjective:    Patient ID: Kaitlyn Fowler, female    DOB: May 17, 1971, 53 y.o.   MRN: [id number]  CC: Chief Complaint  Patient presents with   Annual Exam    With fasting lab work, concerns with head congestion and coughing    HPI: Kaitlyn Fowler is a 53 y.o. female presenting on 03/12/2024 for comprehensive medical examination. Current medical complaints include: head congestion, cough  She has been experiencing nasal congestion and cough for 1 week. She had a sore throat and lost her voice at first, then that got better. She denies fevers, shortness of breath. She is taking dayquil/nyquil which doesn't help too much.  She currently lives with: Roommate Menopausal Symptoms: no  Depression and Anxiety Screen done today and results listed below:     03/12/2024    9:55 AM 01/19/2023    8:26 AM 11/22/2021    2:38 PM 08/23/2021   10:18 AM  Depression screen PHQ 2/9  Decreased Interest 0 0 0 0  Down, Depressed, Hopeless 0 0 0 0  PHQ - 2 Score 0 0 0 0  Altered sleeping 0 0 1 3  Tired, decreased energy 0 3 1 2   Change in appetite 0 0 0 0  Feeling bad or failure about yourself  0 0 0 0  Trouble concentrating 0 0 0 0  Moving slowly or fidgety/restless 0 0 0 0  Suicidal thoughts 0 0 0 0  PHQ-9 Score 0 3 2 5   Difficult doing work/chores  Not difficult at all Not difficult at all Somewhat difficult      03/12/2024    9:55 AM 01/19/2023    8:28 AM 11/22/2021    2:38 PM 08/23/2021   10:18 AM  GAD 7 : Generalized Anxiety Score  Nervous, Anxious, on Edge 0 0 0 0  Control/stop worrying 0 0 0 0  Worry too much - different things 0 0 0 0  Trouble relaxing 0 0 0 0  Restless 0 0 0 0  Easily annoyed or irritable 0 0 0 0  Afraid - awful might happen 0 0 0 0  Total GAD 7 Score 0 0 0 0  Anxiety  Difficulty  Not difficult at all      The patient does not have a history of falls. I did not complete a risk assessment for falls. A plan of care for falls was not documented.   Past Medical History:  Past Medical History:  Diagnosis Date   Anxiety    Hypothyroid    Vitamin D deficiency     Surgical History:  Past Surgical History:  Procedure Laterality Date   EYE SURGERY     HERNIA REPAIR     INCONTINENCE SURGERY      Medications:  Current Outpatient Medications on File Prior to Visit  Medication Sig   Cholecalciferol (VITAMIN D3) 125 MCG (5000 UT) CAPS Take 1 capsule (5,000 Units total) by mouth daily.   citalopram (CELEXA) 40 MG tablet Take 1 tablet (40 mg total) by mouth daily.   levothyroxine (SYNTHROID) 125 MCG tablet TAKE 1 TABLET BY MOUTH DAILY BEFORE BREAKFAST.   clotrimazole (LOTRIMIN) 1 % cream Apply 1 Application topically 2 (  two) times daily.   No current facility-administered medications on file prior to visit.    Allergies:  No Known Allergies  Social History:  Social History   Socioeconomic History   Marital status: Single    Spouse name: Not on file   Number of children: 1   Years of education: Not on file   Highest education level: GED or equivalent  Occupational History   Not on file  Tobacco Use   Smoking status: Former    Current packs/day: 0.00    Average packs/day: 1 pack/day for 23.0 years (23.0 ttl pk-yrs)    Types: Cigarettes    Start date: 25    Quit date: 2011    Years since quitting: 14.2   Smokeless tobacco: Never   Tobacco comments:    Stopped 2018  Vaping Use   Vaping status: Never Used  Substance and Sexual Activity   Alcohol use: Never   Drug use: Never   Sexual activity: Not Currently    Birth control/protection: Post-menopausal  Other Topics Concern   Not on file  Social History Narrative   Not on file   Social Drivers of Health   Financial Resource Strain: Low Risk  (03/11/2024)   Overall Financial Resource  Strain (CARDIA)    Difficulty of Paying Living Expenses: Not hard at all  Food Insecurity: No Food Insecurity (03/11/2024)   Hunger Vital Sign    Worried About Running Out of Food in the Last Year: Never true    Ran Out of Food in the Last Year: Never true  Transportation Needs: No Transportation Needs (03/11/2024)   PRAPARE - Administrator, Civil Service (Medical): No    Lack of Transportation (Non-Medical): No  Physical Activity: Insufficiently Active (03/11/2024)   Exercise Vital Sign    Days of Exercise per Week: 3 days    Minutes of Exercise per Session: 30 min  Stress: No Stress Concern Present (03/11/2024)   [employer] of Occupational Health - Occupational Stress Questionnaire    Feeling of Stress : Not at all  Social Connections: Moderately Isolated (03/11/2024)   Social Connection and Isolation Panel [NHANES]    Frequency of Communication with Friends and Family: More than three times a week    Frequency of Social Gatherings with Friends and Family: Three times a week    Attends Religious Services: 1 to 4 times per year    Active Member of Clubs or Organizations: No    Attends Engineer, structural: Not on file    Marital Status: Divorced  Catering manager Violence: Not on file   Social History   Tobacco Use  Smoking Status Former   Current packs/day: 0.00   Average packs/day: 1 pack/day for 23.0 years (23.0 ttl pk-yrs)   Types: Cigarettes   Start date: 83   Quit date: 2011   Years since quitting: 14.2  Smokeless Tobacco Never  Tobacco Comments   Stopped 2018   Social History   Substance and Sexual Activity  Alcohol Use Never    Family History:  Family History  Adopted: Yes  Problem Relation Age of Onset   Breast cancer Sister     Past medical history, surgical history, medications, allergies, family history and social history reviewed with patient today and changes made to appropriate areas of the chart.   Review of Systems   Constitutional: Negative.   HENT:  Positive for congestion and ear pain. Negative for sore throat.   Eyes: Negative.  Respiratory:  Positive for cough. Negative for shortness of breath.   Cardiovascular: Negative.   Gastrointestinal: Negative.   Genitourinary: Negative.   Musculoskeletal: Negative.   Skin: Negative.   Neurological: Negative.   Psychiatric/Behavioral: Negative.     All other ROS negative except what is listed above and in the HPI.      Objective:    BP 122/86 (BP Location: Left Arm, Patient Position: Sitting, Cuff Size: Large)   Pulse 60   Temp 97.7 F (36.5 C) (Oral)   Ht 5\' 3"  (1.6 m)   Wt 275 lb (124.7 kg)   SpO2 97%   BMI 48.71 kg/m   Wt Readings from Last 3 Encounters:  03/12/24 275 lb (124.7 kg)  07/26/23 273 lb 3.2 oz (123.9 kg)  01/19/23 300 lb (136.1 kg)    Physical Exam Vitals and nursing note reviewed.  Constitutional:      General: She is not in acute distress.    Appearance: Normal appearance.  HENT:     Head: Normocephalic and atraumatic.     Right Ear: Tympanic membrane, ear canal and external ear normal.     Left Ear: Tympanic membrane, ear canal and external ear normal.     Nose: Congestion and rhinorrhea present.     Mouth/Throat:     Mouth: Mucous membranes are moist.     Pharynx: No posterior oropharyngeal erythema.  Eyes:     Conjunctiva/sclera: Conjunctivae normal.  Cardiovascular:     Rate and Rhythm: Normal rate and regular rhythm.     Pulses: Normal pulses.     Heart sounds: Normal heart sounds.  Pulmonary:     Effort: Pulmonary effort is normal.     Breath sounds: Normal breath sounds.  Abdominal:     Palpations: Abdomen is soft.     Tenderness: There is no abdominal tenderness.  Musculoskeletal:        General: Normal range of motion.     Cervical back: Normal range of motion and neck supple.     Right lower leg: No edema.     Left lower leg: No edema.  Lymphadenopathy:     Cervical: No cervical adenopathy.   Skin:    General: Skin is warm and dry.  Neurological:     General: No focal deficit present.     Mental Status: She is alert and oriented to person, place, and time.     Cranial Nerves: No cranial nerve deficit.     Coordination: Coordination normal.     Gait: Gait normal.  Psychiatric:        Mood and Affect: Mood normal.        Behavior: Behavior normal.        Thought Content: Thought content normal.        Judgment: Judgment normal.     Results for orders placed or performed in visit on 07/26/23  Urine cytology ancillary only   Collection Time: 07/26/23 11:38 AM  Result Value Ref Range   Neisseria Gonorrhea Negative    Chlamydia Negative    Comment Normal Reference Ranger Chlamydia - Negative    Comment      Normal Reference Range Neisseria Gonorrhea - Negative  CBC with Differential/Platelet   Collection Time: 07/26/23 11:48 AM  Result Value Ref Range   WBC 5.8 4.0 - 10.5 K/uL   RBC 5.31 (H) 3.87 - 5.11 Mil/uL   Hemoglobin 13.7 12.0 - 15.0 g/dL   HCT 78.2 95.6 - 21.3 %  MCV 81.0 78.0 - 100.0 fl   MCHC 31.8 30.0 - 36.0 g/dL   RDW 09.8 11.9 - 14.7 %   Platelets 281.0 150.0 - 400.0 K/uL   Neutrophils Relative % 59.3 43.0 - 77.0 %   Lymphocytes Relative 32.4 12.0 - 46.0 %   Monocytes Relative 6.0 3.0 - 12.0 %   Eosinophils Relative 1.8 0.0 - 5.0 %   Basophils Relative 0.5 0.0 - 3.0 %   Neutro Abs 3.4 1.4 - 7.7 K/uL   Lymphs Abs 1.9 0.7 - 4.0 K/uL   Monocytes Absolute 0.4 0.1 - 1.0 K/uL   Eosinophils Absolute 0.1 0.0 - 0.7 K/uL   Basophils Absolute 0.0 0.0 - 0.1 K/uL  Comprehensive metabolic panel   Collection Time: 07/26/23 11:48 AM  Result Value Ref Range   Sodium 136 135 - 145 mEq/L   Potassium 4.6 3.5 - 5.1 mEq/L   Chloride 101 96 - 112 mEq/L   CO2 29 19 - 32 mEq/L   Glucose, Bld 93 70 - 99 mg/dL   BUN 17 6 - 23 mg/dL   Creatinine, Ser 8.29 0.40 - 1.20 mg/dL   Total Bilirubin 0.6 0.2 - 1.2 mg/dL   Alkaline Phosphatase 69 39 - 117 U/L   AST 19 0 - 37  U/L   ALT 27 0 - 35 U/L   Total Protein 7.0 6.0 - 8.3 g/dL   Albumin 4.4 3.5 - 5.2 g/dL   GFR 56.21 >30.86 mL/min   Calcium 9.3 8.4 - 10.5 mg/dL  Lipid panel   Collection Time: 07/26/23 11:48 AM  Result Value Ref Range   Cholesterol 215 (H) 0 - 200 mg/dL   Triglycerides 578.4 (H) 0.0 - 149.0 mg/dL   HDL 69.62 (L) >95.28 mg/dL   VLDL 41.3 0.0 - 24.4 mg/dL   LDL Cholesterol 010 (H) 0 - 99 mg/dL   Total CHOL/HDL Ratio 6    NonHDL 178.18   VITAMIN D 25 Hydroxy (Vit-D Deficiency, Fractures)   Collection Time: 07/26/23 11:48 AM  Result Value Ref Range   VITD 65.45 30.00 - 100.00 ng/mL  TSH   Collection Time: 07/26/23 11:48 AM  Result Value Ref Range   TSH 3.49 0.35 - 5.50 uIU/mL  HIV Antibody (routine testing w rflx)   Collection Time: 07/26/23 11:48 AM  Result Value Ref Range   HIV 1&2 Ab, 4th Generation NON-REACTIVE NON-REACTIVE      Assessment & Plan:   Problem List Items Addressed This Visit       Endocrine   Hypothyroidism   Chronic, stable. Continue levothyroxine daily. Check TSH and adjust regimen based on results.       Relevant Orders   TSH     Other   Vitamin D deficiency   Chronic, stable.  She is currently taking an over-the-counter supplement daily.  Check vitamin D levels and adjust regimen based on results.      Relevant Orders   VITAMIN D 25 Hydroxy (Vit-D Deficiency, Fractures)   Morbid obesity (HCC)   BMI 48.7. She has plateaued with her weight but is still doing weight watchers. She is going to start exercising again. Check A1c today.       Relevant Orders   Hemoglobin A1c   Generalized anxiety disorder   Chronic, stable. Continue celexa 40mg  daily.       Pure hypercholesterolemia   Chronic, stable. Check CMP, CBC, A1c and treat based on results.       Encounter for general adult medical  examination with abnormal findings - Primary   Health maintenance reviewed and updated. Discussed nutrition, exercise. Follow-up 1 year.         Other Visit Diagnoses       Upper respiratory tract infection, unspecified type       Most likely viral. Encourage fluids, rest. Can continue dayquil/nyquil prn. Start promethazine dm q4 hr prn cough. F/U if not improving        Follow up plan: Return in about 1 year (around 03/12/2025) for CPE.   LABORATORY TESTING:  - Pap smear: up to date  IMMUNIZATIONS:   - Tdap: Tetanus vaccination status reviewed: last tetanus booster within 10 years. - Influenza: Postponed to flu season - Pneumovax: Not applicable - Prevnar: Declined - HPV: Not applicable - Shingrix vaccine: Up to date  SCREENING: -Mammogram: Ordered today  - Colonoscopy: Ordered today  - Bone Density: Not applicable   PATIENT COUNSELING:   Advised to take 1 mg of folate supplement per day if capable of pregnancy.   Sexuality: Discussed sexually transmitted diseases, partner selection, use of condoms, avoidance of unintended pregnancy  and contraceptive alternatives.   Advised to avoid cigarette smoking.  I discussed with the patient that most people either abstain from alcohol or drink within safe limits (<=14/week and <=4 drinks/occasion for males, <=7/weeks and <= 3 drinks/occasion for females) and that the risk for alcohol disorders and other health effects rises proportionally with the number of drinks per week and how often a drinker exceeds daily limits.  Discussed cessation/primary prevention of drug use and availability of treatment for abuse.   Diet: Encouraged to adjust caloric intake to maintain  or achieve ideal body weight, to reduce intake of dietary saturated fat and total fat, to limit sodium intake by avoiding high sodium foods and not adding table salt, and to maintain adequate dietary potassium and calcium preferably from fresh fruits, vegetables, and low-fat dairy products.    stressed the importance of regular exercise  Injury prevention: Discussed safety belts, safety helmets, smoke detector,  smoking near bedding or upholstery.   Dental health: Discussed importance of regular tooth brushing, flossing, and dental visits.    NEXT PREVENTATIVE PHYSICAL DUE IN 1 YEAR. Return in about 1 year (around 03/12/2025) for CPE.  Charleen Madera A Swetha Rayle

## 2024-03-12 NOTE — Assessment & Plan Note (Signed)
Chronic, stable.  She is currently taking an over-the-counter supplement daily.  Check vitamin D levels and adjust regimen based on results.

## 2024-03-13 ENCOUNTER — Encounter: Payer: Self-pay | Admitting: Nurse Practitioner

## 2024-03-13 NOTE — Telephone Encounter (Signed)
 I called and spoke with patient and notified her of below message and she requested that I send her [facility] phone number by mychart as she is not able to write down number now. I will send by mychart.

## 2024-04-13 ENCOUNTER — Other Ambulatory Visit: Payer: Self-pay | Admitting: Nurse Practitioner

## 2024-04-15 NOTE — Telephone Encounter (Signed)
 Requesting: CITALOPRAM  HBR 40 MG TABLET  Last Visit: 03/12/2024 Next Visit: Visit date not found Last Refill: 01/19/2023  Please Advise

## 2024-05-28 ENCOUNTER — Other Ambulatory Visit: Payer: Self-pay | Admitting: Nurse Practitioner

## 2024-05-28 DIAGNOSIS — E039 Hypothyroidism, unspecified: Secondary | ICD-10-CM

## 2024-11-30 ENCOUNTER — Other Ambulatory Visit: Payer: Self-pay | Admitting: Nurse Practitioner

## 2024-11-30 DIAGNOSIS — E039 Hypothyroidism, unspecified: Secondary | ICD-10-CM

## 2024-12-02 NOTE — Telephone Encounter (Signed)
 Requesting: LEVOTHYROXINE  125 MCG TABLET  Last Visit: 03/12/2024 Next Visit: Visit date not found Last Refill: 05/28/2024  Please Advise
